# Patient Record
Sex: Female | Born: 1995 | Hispanic: Yes | Marital: Married | Smoking: Current every day smoker
Health system: Southern US, Community
[De-identification: ages and names within clinical notes are randomized; demographics above are authoritative.]

## PROBLEM LIST (undated history)

## (undated) DIAGNOSIS — Z789 Other specified health status: Secondary | ICD-10-CM

## (undated) DIAGNOSIS — O1490 Unspecified pre-eclampsia, unspecified trimester: Secondary | ICD-10-CM

## (undated) HISTORY — PX: WISDOM TOOTH EXTRACTION: SHX21

## (undated) HISTORY — PX: NO PAST SURGERIES: SHX2092

## (undated) HISTORY — DX: Unspecified pre-eclampsia, unspecified trimester: O14.90

---

## 2015-07-05 ENCOUNTER — Encounter (HOSPITAL_COMMUNITY): Payer: Self-pay | Admitting: *Deleted

## 2015-07-05 ENCOUNTER — Emergency Department (HOSPITAL_COMMUNITY)
Admission: EM | Admit: 2015-07-05 | Discharge: 2015-07-05 | Disposition: A | Discharge status: Home / Self Care | Payer: Self-pay | Attending: Emergency Medicine | Admitting: Emergency Medicine

## 2015-07-05 ENCOUNTER — Emergency Department (HOSPITAL_COMMUNITY): Payer: Self-pay

## 2015-07-05 DIAGNOSIS — Y9289 Other specified places as the place of occurrence of the external cause: Secondary | ICD-10-CM | POA: Insufficient documentation

## 2015-07-05 DIAGNOSIS — Y9389 Activity, other specified: Secondary | ICD-10-CM | POA: Insufficient documentation

## 2015-07-05 DIAGNOSIS — Z720 Tobacco use: Secondary | ICD-10-CM | POA: Insufficient documentation

## 2015-07-05 DIAGNOSIS — S93402A Sprain of unspecified ligament of left ankle, initial encounter: Secondary | ICD-10-CM | POA: Insufficient documentation

## 2015-07-05 DIAGNOSIS — Y998 Other external cause status: Secondary | ICD-10-CM | POA: Insufficient documentation

## 2015-07-05 DIAGNOSIS — W51XXXA Accidental striking against or bumped into by another person, initial encounter: Secondary | ICD-10-CM | POA: Insufficient documentation

## 2015-07-05 DIAGNOSIS — S8002XA Contusion of left knee, initial encounter: Secondary | ICD-10-CM | POA: Insufficient documentation

## 2015-07-05 MED ORDER — ACETAMINOPHEN 325 MG PO TABS
650.0000 mg | ORAL_TABLET | Freq: Once | ORAL | Status: AC
  Administered 2015-07-05: 650 mg via ORAL
  Filled 2015-07-05: qty 2

## 2015-07-05 MED ORDER — IBUPROFEN 800 MG PO TABS
800.0000 mg | ORAL_TABLET | Freq: Once | ORAL | Status: AC
  Administered 2015-07-05: 800 mg via ORAL
  Filled 2015-07-05: qty 1

## 2015-07-05 NOTE — ED Provider Notes (Signed)
CSN: [id number]     Arrival date & time 07/05/15  1138 History   First MD Initiated Contact with Patient 07/05/15 1318     Chief Complaint  Patient presents with  . Leg Pain     (Consider location/radiation/quality/duration/timing/severity/associated sxs/prior Treatment) HPI Comments: Patient is a 19 year old female who presents to the emergency department with left leg pain. The patient states that on Wednesday, July 27 she and her sister were playing on the bed, when her sister fell on her left leg and she injured the left knee and the left ankle. Patient states she's been unable to put weight on the leg, has been using an Ace wrap and crutches, but with minimal improvement. His been no previous operations or procedures involving the left knee or the left ankle. The patient presents at this time for further evaluation due to the slow progression of her healing process.  Patient is a 19 y.o. female presenting with leg pain. The history is provided by the patient.  Leg Pain Associated symptoms: no back pain and no neck pain     History reviewed. No pertinent past medical history. History reviewed. No pertinent past surgical history. History reviewed. No pertinent family history. History  Substance Use Topics  . Smoking status: Current Every Day Smoker    Types: Cigarettes  . Smokeless tobacco: Not on file  . Alcohol Use: No   OB History    No data available     Review of Systems  Constitutional: Negative for activity change.       All ROS Neg except as noted in HPI  HENT: Negative for nosebleeds.   Eyes: Negative for photophobia and discharge.  Respiratory: Negative for cough, shortness of breath and wheezing.   Cardiovascular: Negative for chest pain and palpitations.  Gastrointestinal: Negative for abdominal pain and blood in stool.  Genitourinary: Negative for dysuria, frequency and hematuria.  Musculoskeletal: Negative for back pain, arthralgias and neck pain.  Skin:  Negative.   Neurological: Negative for dizziness, seizures and speech difficulty.  Psychiatric/Behavioral: Negative for hallucinations and confusion.      Allergies  Review of patient's allergies indicates no known allergies.  Home Medications   Prior to Admission medications   Not on File   BP 102/66 mmHg  Pulse 74  Temp(Src) 98 F (36.7 C) (Oral)  Resp 14  Ht 4' 9.5" (1.461 m)  Wt 98 lb (44.453 kg)  BMI 20.83 kg/m2  SpO2 100%  LMP 06/30/2015 Physical Exam  Constitutional: She is oriented to person, place, and time. She appears well-developed and well-nourished.  Non-toxic appearance.  HENT:  Head: Normocephalic.  Right Ear: Tympanic membrane and external ear normal.  Left Ear: Tympanic membrane and external ear normal.  Eyes: EOM and lids are normal. Pupils are equal, round, and reactive to light.  Neck: Normal range of motion. Neck supple. Carotid bruit is not present.  Cardiovascular: Normal rate, regular rhythm, normal heart sounds, intact distal pulses and normal pulses.   Pulmonary/Chest: Breath sounds normal. No respiratory distress.  Abdominal: Soft. Bowel sounds are normal. There is no tenderness. There is no guarding.  Musculoskeletal: Normal range of motion.  There is full range of motion of the left hip. There is pain with flexion and extension of the left knee. There is no effusion present. There is no posterior mass noted. There is no deformity of the quadricep area, or the patella tendon. The left knee is not hot.  There is pain with range of motion  of the left ankle. There is dorsal area tenderness extending into the lateral malleolus.  Lymphadenopathy:       Head (right side): No submandibular adenopathy present.       Head (left side): No submandibular adenopathy present.    She has no cervical adenopathy.  Neurological: She is alert and oriented to person, place, and time. She has normal strength. No cranial nerve deficit or sensory deficit.  Skin:  Skin is warm and dry.  Psychiatric: She has a normal mood and affect. Her speech is normal.  Nursing note and vitals reviewed.   ED Course  Procedures (including critical care time) Labs Review Labs Reviewed - No data to display  Imaging Review Dg Ankle Complete Left  07/05/2015   CLINICAL DATA:  Left knee and ankle pain.  Fall Wednesday.  EXAM: LEFT ANKLE COMPLETE - 3+ VIEW  COMPARISON:  None.  FINDINGS: There is no evidence of fracture, dislocation, or joint effusion. There is no evidence of arthropathy or other focal bone abnormality. Soft tissues are unremarkable.  IMPRESSION: Negative.   Electronically Signed   By: Charlett Nose M.D.   On: 07/05/2015 12:36   Dg Knee Complete 4 Views Left  07/05/2015   CLINICAL DATA:  Left knee and ankle pain. Fell on left leg Wednesday night.  EXAM: LEFT KNEE - COMPLETE 4+ VIEW  COMPARISON:  None.  FINDINGS: There is no evidence of fracture, dislocation, or joint effusion. There is no evidence of arthropathy or other focal bone abnormality. Soft tissues are unremarkable.  IMPRESSION: Negative.   Electronically Signed   By: Charlett Nose M.D.   On: 07/05/2015 12:35     EKG Interpretation None      MDM  X-ray of the left ankle is negative for fracture or dislocation or joint effusion. X-ray of the left knee is negative for fracture or dislocation or joint effusion. Suspect that the patient has a contusion of the left knee, and sprain of the left ankle. The patient will be fitted with an ankle stirrup splint. Patient has her own crutches. I've asked patient to keep the left extremity elevated, to use 600 mg of ibuprofen 4 times daily, and to see the primary physician or return to the emergency department if not improving. Patient will receive a work excuse for August 1, returning to work on August 2.    Final diagnoses:  Contusion, knee, left, initial encounter  Ankle sprain, left, initial encounter    *I have reviewed nursing notes, vital signs, and  all appropriate lab and imaging results for this patient.[address], PA-C 07/05/15 1355  Rolland Porter, MD 07/11/15 [phone]

## 2015-07-05 NOTE — ED Notes (Signed)
EDPa in room with pt. 

## 2015-07-05 NOTE — ED Notes (Signed)
Pt states sister fell on her left leg on Wednesday night while playing around on the bed, pt states unable to put weight on leg, pt has ACE wrap and knee brace that she applied at home PTA

## 2015-07-05 NOTE — Discharge Instructions (Signed)
The x-ray of your knee and ankle are negative for fracture or dislocation. Please use the ankle splint over the next 7 days. Please elevate your legs above your waist over the next 3 or 4 days. Please use 600 mg of ibuprofen with breakfast, lunch, dinner, and at bedtime until soreness has resolved. Please use your crutches until you can safely apply weight to your left lower extremity. Ankle Sprain An ankle sprain is an injury to the strong, fibrous tissues (ligaments) that hold your ankle bones together.  HOME CARE   Put ice on your ankle for 1-2 days or as told by your doctor.  Put ice in a plastic bag.  Place a towel between your skin and the bag.  Leave the ice on for 15-20 minutes at a time, every 2 hours while you are awake.  Only take medicine as told by your doctor.  Raise (elevate) your injured ankle above the level of your heart as much as possible for 2-3 days.  Use crutches if your doctor tells you to. Slowly put your own weight on the affected ankle. Use the crutches until you can walk without pain.  If you have a plaster splint:  Do not rest it on anything harder than a pillow for 24 hours.  Do not put weight on it.  Do not get it wet.  Take it off to shower or bathe.  If given, use an elastic wrap or support stocking for support. Take the wrap off if your toes lose feeling (numb), tingle, or turn cold or blue.  If you have an air splint:  Add or let out air to make it comfortable.  Take it off at night and to shower and bathe.  Wiggle your toes and move your ankle up and down often while you are wearing it. GET HELP IF:  You have rapidly increasing bruising or puffiness (swelling).  Your toes feel very cold.  You lose feeling in your foot.  Your medicine does not help your pain. GET HELP RIGHT AWAY IF:   Your toes lose feeling (numb) or turn blue.  You have severe pain that is increasing. MAKE SURE YOU:   Understand these instructions.  Will  watch your condition.  Will get help right away if you are not doing well or get worse. Document Released: 05/10/2008 Document Revised: 04/08/2014 Document Reviewed: 06/05/2012 [facility] Patient Information 2015 [place], [place]. This information is not intended to replace advice given to you by your health care provider. Make sure you discuss any questions you have with your health care provider.

## 2017-11-25 ENCOUNTER — Encounter: Payer: Self-pay | Admitting: Adult Health

## 2017-11-25 ENCOUNTER — Encounter: Payer: Self-pay | Admitting: Women's Health

## 2017-11-25 ENCOUNTER — Ambulatory Visit: Payer: Self-pay | Admitting: Women's Health

## 2017-11-25 DIAGNOSIS — Z3046 Encounter for surveillance of implantable subdermal contraceptive: Secondary | ICD-10-CM

## 2017-11-25 NOTE — Progress Notes (Signed)
   NEXPLANON REMOVAL Patient name: Tara [name] MRN [id number]  Date of birth: [birth year] Subjective Findings:   Tara [name] is a 21 y.o. G0 Hispanic female being seen today for removal of a Nexplanon. [facility]  She desires removal because it's time. Signed copy of informed consent in chart.   Patient's last menstrual period was 10/29/2017. Last pap not yet, turned 21yo in May. Results were:  n/a The planned method of family planning is condoms  Pertinent History Reviewed:   Reviewed past medical,surgical, social, obstetrical and family history.  Reviewed problem list, medications and allergies. Objective Findings & Procedure:    Vitals:   11/25/17 1039  BP: 110/70  Pulse: 94  Weight: 112 lb 6.4 oz (51 kg)  Height: 4\' 9"  (1.448 m)  Body mass index is 24.32 kg/m.  No results found for this or any previous visit (from the past 24 hour(s)).   Time out was performed.  Nexplanon site identified.  Area prepped in usual sterile fashon. One cc of 2% lidocaine was used to anesthetize the area at the distal end of the implant. A small stab incision was made right beside the implant on the distal portion.  The Nexplanon rod was grasped using hemostats and removed without difficulty.  There was less than 3 cc blood loss. There were no complications.  Steri-strips were applied over the small incision and a pressure bandage was applied.  The patient tolerated the procedure well. Assessment & Plan:   1) Nexplanon removal She was instructed to keep the area clean and dry, remove pressure bandage in 24 hours, and keep insertion site covered with the steri-strip for 3-5 days.   Follow-up PRN problems.  No orders of the defined types were placed in this encounter.   Follow-up: Return for here or [facility] for pap & physical.  Marge DuncansBooker, Kimberly Randall CNM, [facility] 11/25/2017 11:11 AM

## 2017-11-25 NOTE — Patient Instructions (Signed)
Keep the area clean and dry.  You can remove the big bandage in 24 hours, and the small steri-strip bandage in 3-5 days.   If you have any concerns, please give us a call.   

## 2017-12-06 NOTE — L&D Delivery Note (Addendum)
Delivery Note At 1:42 PM a viable female was delivered via Vaginal, Spontaneous (Presentation: ROA).  APGAR: , ; weight  .   Placenta status: Intact, spontaneous delivery.  Cord: 3 vessel with no complications.  Cord pH: n/a  Patient is a 22 y.o. now G1P0 s/p NSVD at [redacted]w[redacted]d, who was admitted for IOL for preeclampsia.  She progressed with Pitocin augmentation to complete and pushed through 3 contractions to deliver.  Cord clamping delayed by one minute then clamped by CNM and cut by FOB.  Placenta intact and spontaneous, bleeding minimal.  No  Laceration.  She plans on breastfeeding. She requests Nexplanon for birth control.  Anesthesia: Epidural Episiotomy: None Lacerations: None Suture Repair: n/a Est. Blood Loss (mL): 150  Mom to postpartum.  Baby to Couplet care / Skin to Skin.  Bernerd Limbo, CNM 10/10/18, 2:01 PM

## 2018-04-13 ENCOUNTER — Other Ambulatory Visit (HOSPITAL_COMMUNITY): Payer: Self-pay | Admitting: Family

## 2018-04-13 DIAGNOSIS — Z369 Encounter for antenatal screening, unspecified: Secondary | ICD-10-CM

## 2018-04-13 LAB — OB RESULTS CONSOLE HIV ANTIBODY (ROUTINE TESTING): HIV: NONREACTIVE

## 2018-04-13 LAB — OB RESULTS CONSOLE RPR: RPR: NONREACTIVE

## 2018-04-13 LAB — OB RESULTS CONSOLE GC/CHLAMYDIA
Chlamydia: NEGATIVE
GC PROBE AMP, GENITAL: NEGATIVE

## 2018-04-13 LAB — OB RESULTS CONSOLE HEPATITIS B SURFACE ANTIGEN: Hepatitis B Surface Ag: NEGATIVE

## 2018-04-13 LAB — OB RESULTS CONSOLE RUBELLA ANTIBODY, IGM: RUBELLA: IMMUNE

## 2018-05-02 ENCOUNTER — Encounter (HOSPITAL_COMMUNITY): Payer: Self-pay

## 2018-05-08 ENCOUNTER — Encounter (HOSPITAL_COMMUNITY): Payer: Self-pay | Admitting: *Deleted

## 2018-05-09 ENCOUNTER — Encounter (HOSPITAL_COMMUNITY): Payer: Self-pay

## 2018-05-09 ENCOUNTER — Ambulatory Visit (HOSPITAL_COMMUNITY)
Admission: RE | Admit: 2018-05-09 | Discharge: 2018-05-09 | Disposition: A | Discharge status: Home / Self Care | Payer: Medicaid Other | Source: Ambulatory Visit | Attending: Family | Admitting: Family

## 2018-05-09 ENCOUNTER — Other Ambulatory Visit (HOSPITAL_COMMUNITY): Payer: Self-pay | Admitting: Family

## 2018-05-09 DIAGNOSIS — O99331 Smoking (tobacco) complicating pregnancy, first trimester: Secondary | ICD-10-CM | POA: Insufficient documentation

## 2018-05-09 DIAGNOSIS — Z369 Encounter for antenatal screening, unspecified: Secondary | ICD-10-CM

## 2018-05-09 DIAGNOSIS — Z3A14 14 weeks gestation of pregnancy: Secondary | ICD-10-CM | POA: Insufficient documentation

## 2018-05-09 DIAGNOSIS — O99332 Smoking (tobacco) complicating pregnancy, second trimester: Secondary | ICD-10-CM

## 2018-05-09 DIAGNOSIS — Z3689 Encounter for other specified antenatal screening: Secondary | ICD-10-CM

## 2018-05-09 HISTORY — DX: Other specified health status: Z78.9

## 2018-08-24 IMAGING — US US MFM OB COMP +14 WKS
1 series · 14 of 28 positions shown · non-contrast
Comparison: none

[Series 1: us mfm ob comp +14 wks · 14 of 45 slices shown]
[im 2/45]
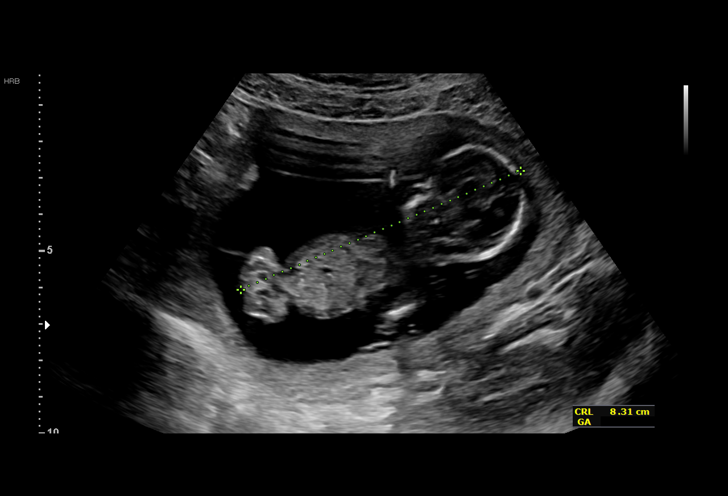
[im 5/45]
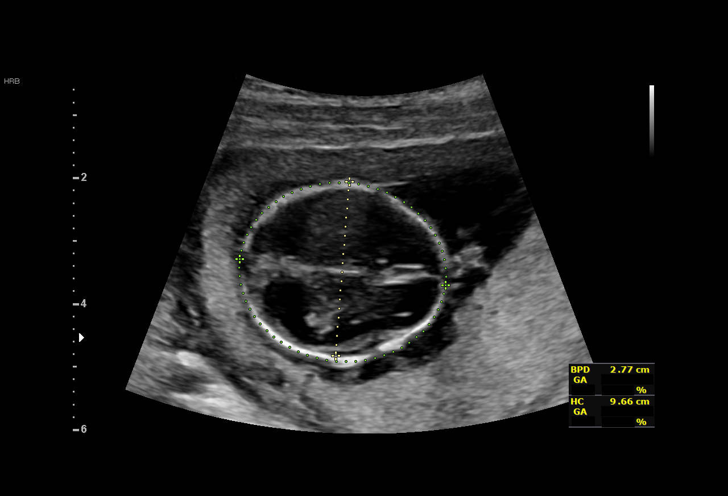
[im 9/45]
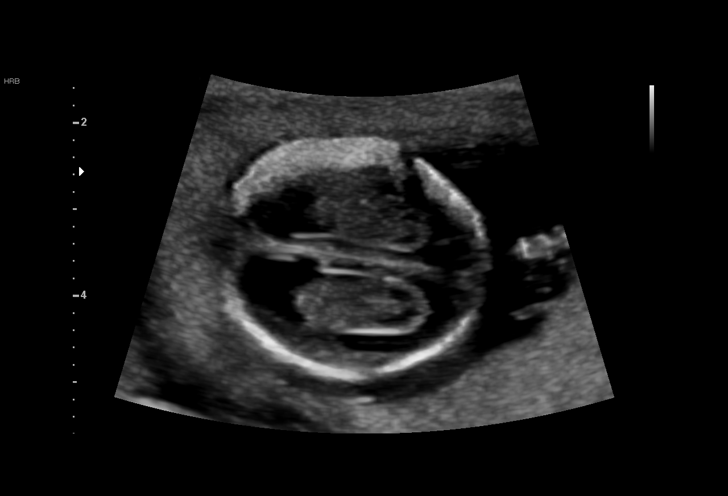
[im 12/45]
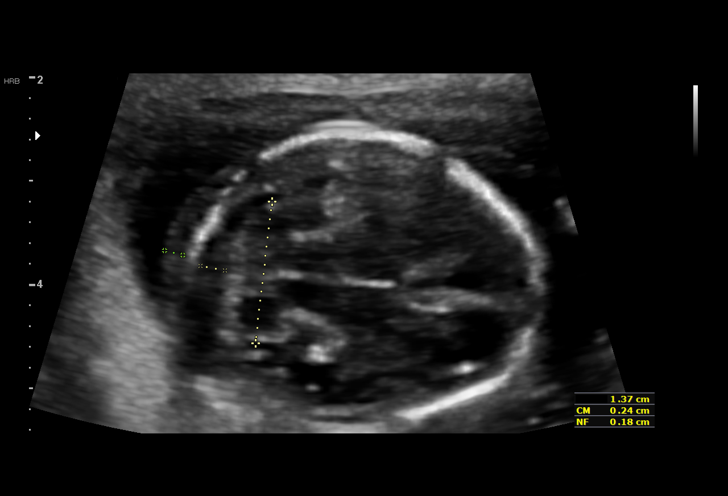
[im 15/45]
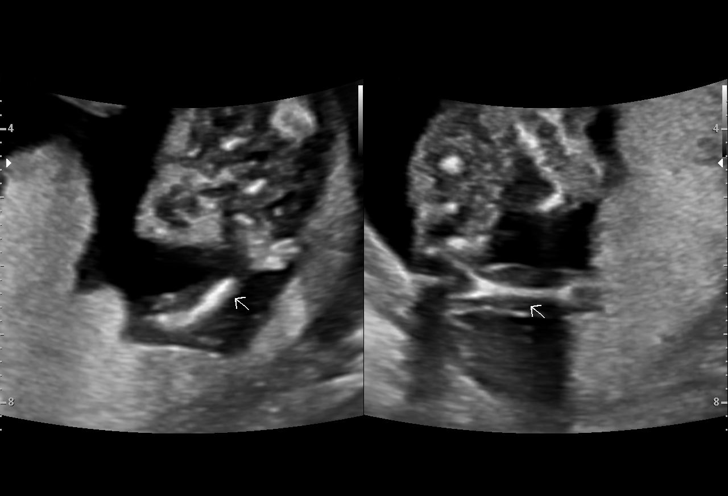
[im 18/45]
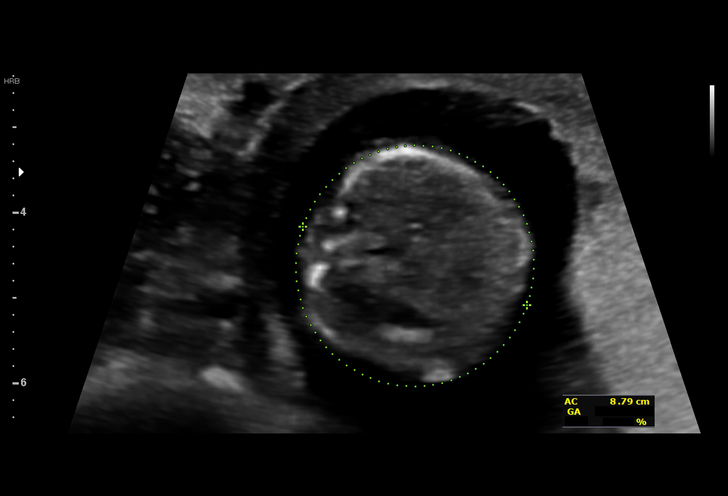
[im 22/45]
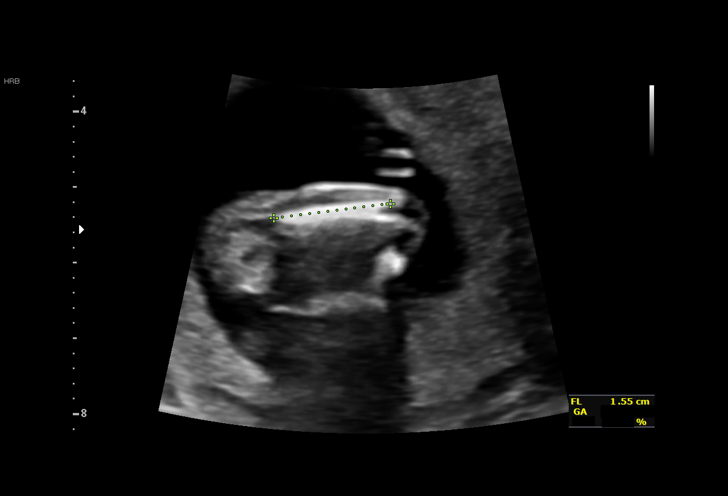
[im 25/45]
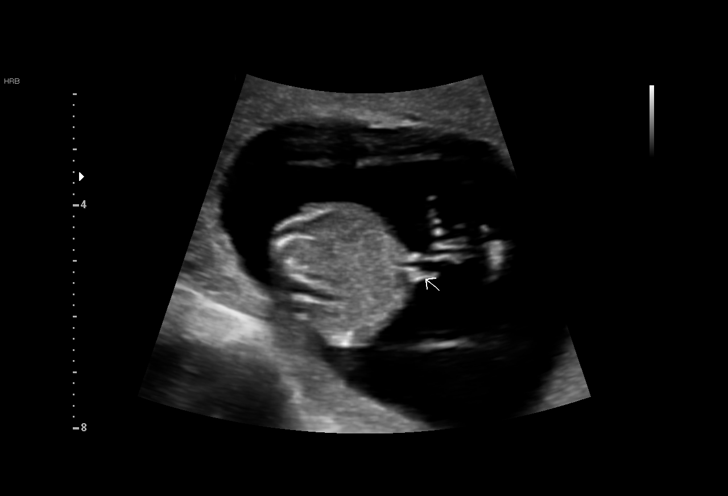
[im 28/45]
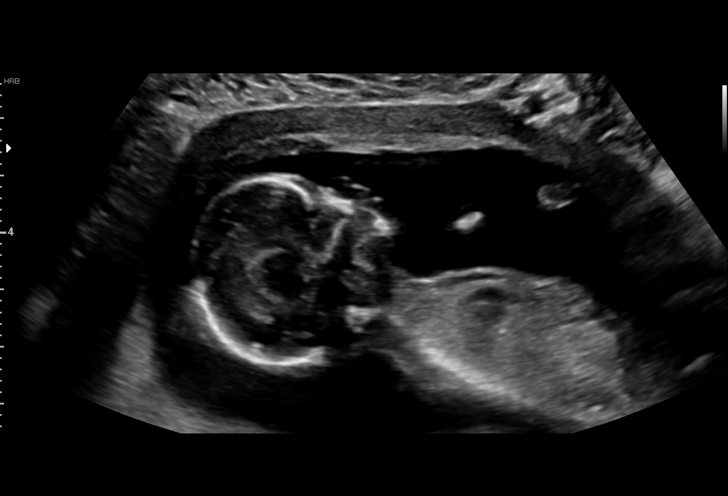
[im 31/45]
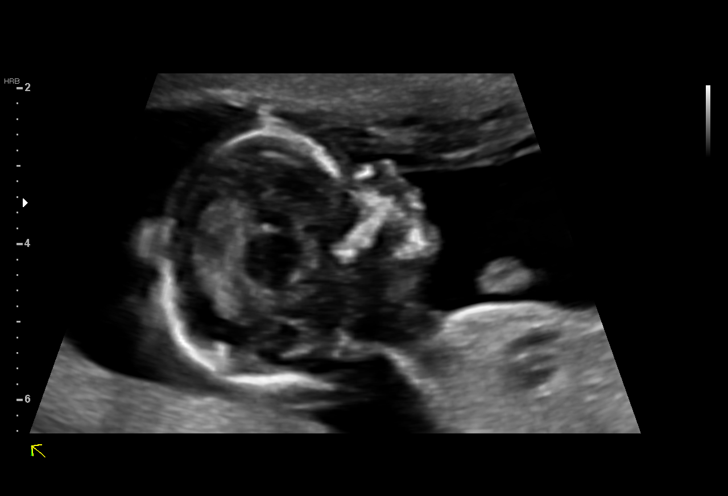
[im 35/45]
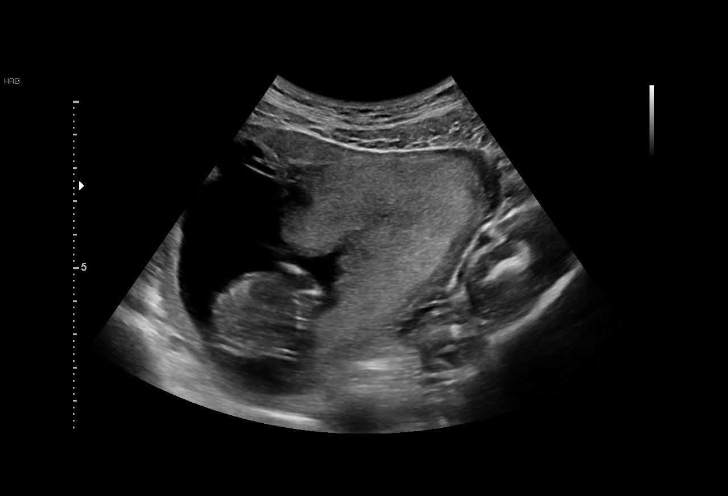
[im 38/45]
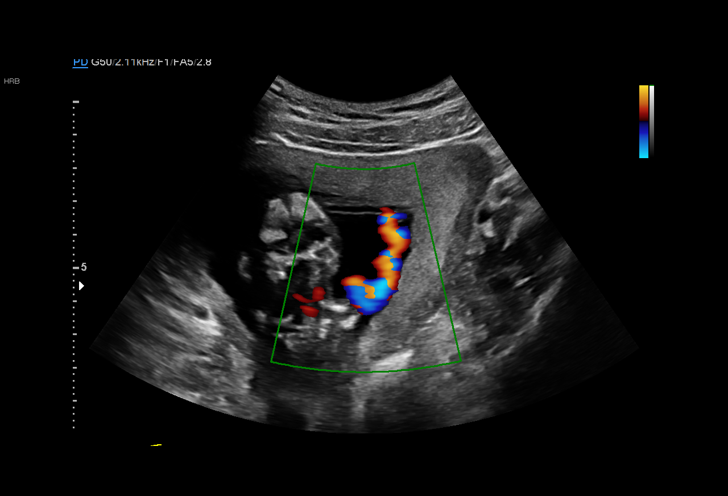
[im 41/45]
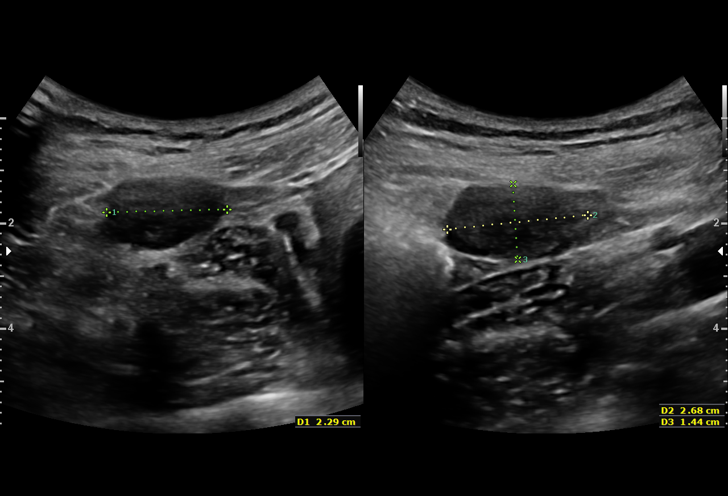
[im 45/45]
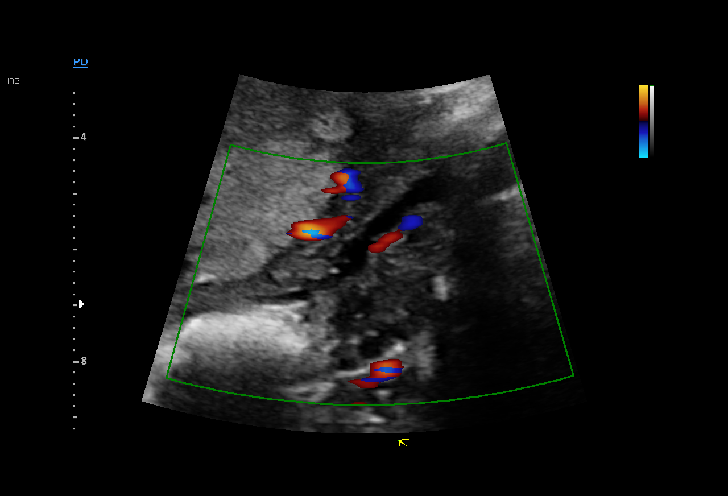

[14 of 28 positions shown; findings below may reference images not displayed]

Health Department
Morice NP

[address]                [id number]      [phone]     [id number]
PAA QUASI
Indications

14 weeks gestation of pregnancy
Encounter for antenatal screening for
malformations
Tobacco use complicating pregnancy, first
trimester
OB History

Blood Type:            Height:  4'10"  Weight (lb):  114       BMI:
Gravidity:    1
Fetal Evaluation

Num Of Fetuses:     1
Fetal Heart         150
Rate(bpm):
Cardiac Activity:   Observed
Presentation:       Variable
Placenta:           Anterior, above cervical os
P. Cord Insertion:  Visualized, central

Amniotic Fluid
AFI FV:      Subjectively within normal limits

Largest Pocket(cm)
4.63
Biometry

BPD:      28.2  mm     G. Age:  15w 0d                  CI:         82.1   %    70 - 86
FL/HC:      14.3   %    15.3 -
HC:       98.2  mm     G. Age:  14w 4d                  HC/AC:      1.14        1.05 -
AC:       86.5  mm     G. Age:  15w 0d                  FL/BPD:     49.6   %
FL:         14  mm     G. Age:  14w 1d                  FL/AC:      16.2   %    20 - 24
HUM:      12.6  mm     G. Age:  13w 3d
CER:      13.7  mm     G. Age:  N/A
NFT:       1.8  mm
CM:        2.4  mm

Est. FW:     100  gm      0 lb 4 oz
Gestational Age

LMP:           13w 5d        Date:  02/02/18                 EDD:   11/09/18
U/S Today:     14w 5d                                        EDD:   11/02/18
Best:          14w 5d     Det. By:  U/S (05/09/18)           EDD:   11/02/18
Anatomy

Cranium:               Appears normal         Aortic Arch:            Not well visualized
Cavum:                 Appears normal         Ductal Arch:            Not well visualized
Ventricles:            Not well visualized    Diaphragm:              Not well visualized
Choroid Plexus:        Heterogenous           Stomach:                Appears normal, left
sided
Cerebellum:            Not well visualized    Abdomen:                Appears normal
Posterior Fossa:       Appears normal         Abdominal Wall:         Appears nml (cord
insert, abd wall)
Nuchal Fold:           Appears normal         Cord Vessels:           Not well visualized
Face:                  Profile nl; orbits not Kidneys:                Not well visualized
well visualized
Lips:                  Not well visualized    Bladder:                Appears normal
Thoracic:              Appears normal         Spine:                  Not well visualized
Heart:                 Not well visualized    Upper Extremities:      Appears normal
RVOT:                  Not well visualized    Lower Extremities:      Appears normal
LVOT:                  Not well visualized

Other:  Technically difficult due to early gestational age.
Cervix Uterus Adnexa

Cervix
Normal appearance by transabdominal scan.

Uterus
No abnormality visualized.

Left Ovary
Size(cm)     2.05   x   1.08   x  1.77      Vol(ml):
Within normal limits.

Right Ovary
Size(cm)     2.68   x   1.44   x  2.29      Vol(ml):
Within normal limits.

Cul De Sac:   No free fluid seen.

Adnexa:       No abnormality visualized.
Impression

SIUP at 14+5 weeks
Normal but limited detailed fetal anatomy; no gross
abnormalities identified
Markers of aneuploidy: none
Normal amniotic fluid volume
EDC based on today's measurements: 11/02/18
Discussed option of quad screen starting at 15 weeks
Recommendations

Follow-up ultrasound in 4-6 weeks to complete anatomy
survey

## 2018-10-09 ENCOUNTER — Other Ambulatory Visit: Payer: Self-pay

## 2018-10-09 ENCOUNTER — Inpatient Hospital Stay (HOSPITAL_COMMUNITY)
Admission: AD | Admit: 2018-10-09 | Discharge: 2018-10-12 | DRG: 807 | Disposition: A | Discharge status: Home / Self Care | Payer: Medicaid Other | Attending: Family Medicine | Admitting: Family Medicine

## 2018-10-09 ENCOUNTER — Encounter (HOSPITAL_COMMUNITY): Payer: Self-pay

## 2018-10-09 DIAGNOSIS — O42913 Preterm premature rupture of membranes, unspecified as to length of time between rupture and onset of labor, third trimester: Principal | ICD-10-CM | POA: Diagnosis present

## 2018-10-09 DIAGNOSIS — O42013 Preterm premature rupture of membranes, onset of labor within 24 hours of rupture, third trimester: Secondary | ICD-10-CM | POA: Diagnosis not present

## 2018-10-09 DIAGNOSIS — O1414 Severe pre-eclampsia complicating childbirth: Secondary | ICD-10-CM | POA: Diagnosis present

## 2018-10-09 DIAGNOSIS — F17210 Nicotine dependence, cigarettes, uncomplicated: Secondary | ICD-10-CM | POA: Diagnosis present

## 2018-10-09 DIAGNOSIS — O9902 Anemia complicating childbirth: Secondary | ICD-10-CM | POA: Diagnosis present

## 2018-10-09 DIAGNOSIS — D649 Anemia, unspecified: Secondary | ICD-10-CM | POA: Diagnosis present

## 2018-10-09 DIAGNOSIS — Z3A36 36 weeks gestation of pregnancy: Secondary | ICD-10-CM | POA: Diagnosis not present

## 2018-10-09 DIAGNOSIS — E669 Obesity, unspecified: Secondary | ICD-10-CM | POA: Diagnosis present

## 2018-10-09 DIAGNOSIS — O99334 Smoking (tobacco) complicating childbirth: Secondary | ICD-10-CM | POA: Diagnosis present

## 2018-10-09 DIAGNOSIS — O99214 Obesity complicating childbirth: Secondary | ICD-10-CM | POA: Diagnosis present

## 2018-10-09 DIAGNOSIS — O4290 Premature rupture of membranes, unspecified as to length of time between rupture and onset of labor, unspecified weeks of gestation: Secondary | ICD-10-CM | POA: Diagnosis present

## 2018-10-09 DIAGNOSIS — O1410 Severe pre-eclampsia, unspecified trimester: Secondary | ICD-10-CM | POA: Diagnosis not present

## 2018-10-09 LAB — TYPE AND SCREEN
ABO/RH(D): O POS
Antibody Screen: NEGATIVE

## 2018-10-09 LAB — CBC
HCT: 33.3 % — ABNORMAL LOW (ref 36.0–46.0)
Hemoglobin: 11.2 g/dL — ABNORMAL LOW (ref 12.0–15.0)
MCH: 29.1 pg (ref 26.0–34.0)
MCHC: 33.6 g/dL (ref 30.0–36.0)
MCV: 86.5 fL (ref 80.0–100.0)
PLATELETS: 169 10*3/uL (ref 150–400)
RBC: 3.85 MIL/uL — AB (ref 3.87–5.11)
RDW: 12.8 % (ref 11.5–15.5)
WBC: 9.5 10*3/uL (ref 4.0–10.5)

## 2018-10-09 LAB — RPR: RPR: NONREACTIVE

## 2018-10-09 LAB — COMPREHENSIVE METABOLIC PANEL
ALT: 12 U/L (ref 0–44)
AST: 20 U/L (ref 15–41)
Albumin: 2.9 g/dL — ABNORMAL LOW (ref 3.5–5.0)
Alkaline Phosphatase: 247 U/L — ABNORMAL HIGH (ref 38–126)
Anion gap: 8 (ref 5–15)
BILIRUBIN TOTAL: 0.6 mg/dL (ref 0.3–1.2)
BUN: 7 mg/dL (ref 6–20)
CO2: 21 mmol/L — ABNORMAL LOW (ref 22–32)
CREATININE: 0.54 mg/dL (ref 0.44–1.00)
Calcium: 8.4 mg/dL — ABNORMAL LOW (ref 8.9–10.3)
Chloride: 106 mmol/L (ref 98–111)
Glucose, Bld: 82 mg/dL (ref 70–99)
POTASSIUM: 3.9 mmol/L (ref 3.5–5.1)
Sodium: 135 mmol/L (ref 135–145)
Total Protein: 5.9 g/dL — ABNORMAL LOW (ref 6.5–8.1)

## 2018-10-09 LAB — PROTEIN / CREATININE RATIO, URINE
Creatinine, Urine: 32 mg/dL
Protein Creatinine Ratio: 0.97 mg/mg{Cre} — ABNORMAL HIGH (ref 0.00–0.15)
Total Protein, Urine: 31 mg/dL

## 2018-10-09 LAB — ABO/RH: ABO/RH(D): O POS

## 2018-10-09 LAB — POCT FERN TEST: POCT Fern Test: POSITIVE

## 2018-10-09 MED ORDER — DIPHENHYDRAMINE HCL 50 MG/ML IJ SOLN: 12.5000 mg | INTRAMUSCULAR | Status: DC | PRN

## 2018-10-09 MED ORDER — LACTATED RINGERS IV SOLN
500.0000 mL | INTRAVENOUS | Status: DC | PRN
  Administered 2018-10-09: 1000 mL via INTRAVENOUS

## 2018-10-09 MED ORDER — PNEUMOCOCCAL VAC POLYVALENT 25 MCG/0.5ML IJ INJ
0.5000 mL | INJECTION | INTRAMUSCULAR | Status: DC
  Filled 2018-10-09: qty 0.5

## 2018-10-09 MED ORDER — LACTATED RINGERS IV SOLN
INTRAVENOUS | Status: DC
  Administered 2018-10-09 – 2018-10-10 (×5): via INTRAVENOUS

## 2018-10-09 MED ORDER — OXYTOCIN BOLUS FROM INFUSION
500.0000 mL | Freq: Once | INTRAVENOUS | Status: AC
  Administered 2018-10-10: 500 mL via INTRAVENOUS

## 2018-10-09 MED ORDER — MISOPROSTOL 50MCG HALF TABLET
50.0000 ug | ORAL_TABLET | ORAL | Status: DC | PRN
  Administered 2018-10-09 (×4): 50 ug via ORAL
  Filled 2018-10-09 (×6): qty 1

## 2018-10-09 MED ORDER — SODIUM CHLORIDE 0.9 % IV SOLN
5.0000 10*6.[IU] | Freq: Once | INTRAVENOUS | Status: AC
  Administered 2018-10-09: 5 10*6.[IU] via INTRAVENOUS
  Filled 2018-10-09: qty 5

## 2018-10-09 MED ORDER — MAGNESIUM SULFATE BOLUS VIA INFUSION
4.0000 g | Freq: Once | INTRAVENOUS | Status: AC
  Administered 2018-10-09: 4 g via INTRAVENOUS
  Filled 2018-10-09: qty 500

## 2018-10-09 MED ORDER — HYDRALAZINE HCL 20 MG/ML IJ SOLN: 10.0000 mg | INTRAMUSCULAR | Status: DC | PRN

## 2018-10-09 MED ORDER — PHENYLEPHRINE 40 MCG/ML (10ML) SYRINGE FOR IV PUSH (FOR BLOOD PRESSURE SUPPORT)
80.0000 ug | PREFILLED_SYRINGE | INTRAVENOUS | Status: DC | PRN
  Filled 2018-10-09: qty 10
  Filled 2018-10-09: qty 5

## 2018-10-09 MED ORDER — ACETAMINOPHEN 325 MG PO TABS: 650.0000 mg | ORAL_TABLET | ORAL | Status: DC | PRN

## 2018-10-09 MED ORDER — MAGNESIUM SULFATE 40 G IN LACTATED RINGERS - SIMPLE
2.0000 g/h | INTRAVENOUS | Status: DC
  Administered 2018-10-09: 2 g/h via INTRAVENOUS
  Filled 2018-10-09: qty 500

## 2018-10-09 MED ORDER — PENICILLIN G 3 MILLION UNITS IVPB - SIMPLE MED
3.0000 10*6.[IU] | INTRAVENOUS | Status: DC
  Administered 2018-10-09 – 2018-10-10 (×6): 3 10*6.[IU] via INTRAVENOUS
  Filled 2018-10-09 (×8): qty 100

## 2018-10-09 MED ORDER — FENTANYL 2.5 MCG/ML BUPIVACAINE 1/10 % EPIDURAL INFUSION (WH - ANES)
11.0000 mL/h | INTRAMUSCULAR | Status: DC | PRN
  Administered 2018-10-10: 14 mL/h via EPIDURAL
  Filled 2018-10-09: qty 100

## 2018-10-09 MED ORDER — BETAMETHASONE SOD PHOS & ACET 6 (3-3) MG/ML IJ SUSP
12.0000 mg | INTRAMUSCULAR | Status: AC
  Administered 2018-10-09 – 2018-10-10 (×2): 12 mg via INTRAMUSCULAR
  Filled 2018-10-09 (×2): qty 2

## 2018-10-09 MED ORDER — FENTANYL CITRATE (PF) 100 MCG/2ML IJ SOLN
100.0000 ug | INTRAMUSCULAR | Status: DC | PRN
  Administered 2018-10-10 (×2): 100 ug via INTRAVENOUS
  Filled 2018-10-09 (×2): qty 2

## 2018-10-09 MED ORDER — ONDANSETRON HCL 4 MG/2ML IJ SOLN: 4.0000 mg | Freq: Four times a day (QID) | INTRAMUSCULAR | Status: DC | PRN

## 2018-10-09 MED ORDER — OXYTOCIN 40 UNITS IN LACTATED RINGERS INFUSION - SIMPLE MED
2.5000 [IU]/h | INTRAVENOUS | Status: DC
  Filled 2018-10-09: qty 1000

## 2018-10-09 MED ORDER — LABETALOL HCL 5 MG/ML IV SOLN: 80.0000 mg | INTRAVENOUS | Status: DC | PRN

## 2018-10-09 MED ORDER — SOD CITRATE-CITRIC ACID 500-334 MG/5ML PO SOLN: 30.0000 mL | ORAL | Status: DC | PRN

## 2018-10-09 MED ORDER — TERBUTALINE SULFATE 1 MG/ML IJ SOLN: 0.2500 mg | Freq: Once | INTRAMUSCULAR | Status: DC | PRN

## 2018-10-09 MED ORDER — LIDOCAINE HCL (PF) 1 % IJ SOLN
30.0000 mL | INTRAMUSCULAR | Status: DC | PRN
  Filled 2018-10-09: qty 30

## 2018-10-09 MED ORDER — LACTATED RINGERS IV SOLN: 500.0000 mL | Freq: Once | INTRAVENOUS | Status: DC

## 2018-10-09 MED ORDER — LABETALOL HCL 5 MG/ML IV SOLN: 20.0000 mg | INTRAVENOUS | Status: DC | PRN

## 2018-10-09 MED ORDER — LABETALOL HCL 5 MG/ML IV SOLN: 40.0000 mg | INTRAVENOUS | Status: DC | PRN

## 2018-10-09 NOTE — H&P (Addendum)
LABOR AND DELIVERY ADMISSION HISTORY AND PHYSICAL NOTE  Tara [name] is a 22 y.o. female G1P0 with IUP at [redacted]w[redacted]d presenting for PPROM confirmed by positive fern test.  She first noticed leakage of "clear, gummy fluid" around 3 AM 10/09/18 that persisted for about 1 hour, which then prompted her to seek evaluation in [facility]. She reports positive fetal movement. Denies contractions, additional leakage of fluid, or vaginal bleeding.  Prenatal History/Complications: PNC at [facility] Pregnancy complications:  - Tobacco use throughout pregnancy (decreased daily cigarette use to 1 cigarette/day at onset of pregnancy) - Otherwise uncomplicated pregnancy. Only medication is a prenatal vitamin and no significant PMHx.  Past Medical History: Past Medical History:  Diagnosis Date  . Medical history non-contributory     Past Surgical History: Past Surgical History:  Procedure Laterality Date  . NO PAST SURGERIES      Obstetrical History: OB History    Gravida  1   Para      Term      Preterm      AB      Living  0     SAB      TAB      Ectopic      Multiple      Live Births              Social History: Social History   Socioeconomic History  . Marital status: Married    Spouse name: Not on file  . Number of children: Not on file  . Years of education: Not on file  . Highest education level: Not on file  Occupational History  . Not on file  Social Needs  . Financial resource strain: Not on file  . Food insecurity:    Worry: Not on file    Inability: Not on file  . Transportation needs:    Medical: Not on file    Non-medical: Not on file  Tobacco Use  . Smoking status: Current Every Day Smoker    Packs/day: 0.10    Types: Cigarettes  . Smokeless tobacco: Current User  Substance and Sexual Activity  . Alcohol use: No  . Drug use: No  . Sexual activity: Yes    Partners: Male    Birth control/protection: None  Lifestyle  . Physical activity:    Days per  week: Not on file    Minutes per session: Not on file  . Stress: Not on file  Relationships  . Social connections:    Talks on phone: Not on file    Gets together: Not on file    Attends religious service: Not on file    Active member of club or organization: Not on file    Attends meetings of clubs or organizations: Not on file    Relationship status: Not on file  Other Topics Concern  . Not on file  Social History Narrative  . Not on file    Family History: Family History  Problem Relation Age of Onset  . Depression Maternal Grandmother   . Arthritis Maternal Grandmother   . Hypertension Maternal Grandmother   . Lung cancer Maternal Grandfather     Allergies: No Known Allergies  Medications Prior to Admission  Medication Sig Dispense Refill Last Dose  . Prenatal Vit-Fe Fumarate-FA (PRENATAL VITAMIN PO) Take by mouth.   10/08/2018 at Unknown time     Review of Systems  All systems reviewed and negative except as stated in HPI  Physical Exam Blood pressure 133/74,  pulse 63, temperature 97.9 F (36.6 C), temperature source Oral, resp. rate 20, height 4' 9.5" (1.461 m), weight 67.6 kg, last menstrual period 02/02/2018, SpO2 100 %. General appearance: alert, oriented, NAD Lungs: normal respiratory effort Heart: regular rate Abdomen: soft, non-tender; gravid, FH appropriate for GA Extremities: No calf swelling or tenderness Presentation: cephalic Fetal monitoring: baseline 130, moderate variability, positive accelerations, no decelerations. Toco: irregular contractions q 2-5 minutes Dilation: 1 Effacement (%): Thick Station: -2 Exam by:: Dr Annia Friendly  Prenatal labs: ABO, Rh: --/--/O POS (11/04 0636) Antibody: NEG (11/04 0636) Rubella: Immune (05/09 0000) RPR: Nonreactive (05/09 0000)  HBsAg: Negative (05/09 0000)  HIV: Non-reactive (05/09 0000)  GC/Chlamydia: Negative (09/14/18) GBS:   Unknown 2-hr GTT:  Genetic screening:  QUAD Negative Anatomy US:  Normal  Prenatal Transfer Tool  Maternal Diabetes: No Genetic Screening: Normal Maternal Ultrasounds/Referrals: Normal Fetal Ultrasounds or other Referrals:  None Maternal Substance Abuse:  Yes:  Type: Smoker Significant Maternal Medications:  None Significant Maternal Lab Results: None  Results for orders placed or performed during the hospital encounter of 10/09/18 (from the past 24 hour(s))  POCT fern test   Collection Time: 10/09/18  4:59 AM  Result Value Ref Range   POCT Fern Test Positive = ruptured amniotic membanes   CBC   Collection Time: 10/09/18  6:36 AM  Result Value Ref Range   WBC 9.5 4.0 - 10.5 K/uL   RBC 3.85 (L) 3.87 - 5.11 MIL/uL   Hemoglobin 11.2 (L) 12.0 - 15.0 g/dL   HCT 91.4 (L) 78.2 - 95.6 %   MCV 86.5 80.0 - 100.0 fL   MCH 29.1 26.0 - 34.0 pg   MCHC 33.6 30.0 - 36.0 g/dL   RDW 21.3 08.6 - 57.8 %   Platelets 169 150 - 400 K/uL  Type and screen [facility]   Collection Time: 10/09/18  6:36 AM  Result Value Ref Range   ABO/RH(D) O POS    Antibody Screen NEG    Sample Expiration      10/12/2018 Performed at [facility], [address], [place], [place] [address]     Patient Active Problem List   Diagnosis Date Noted  . PROM (premature rupture of membranes) 10/09/2018  . Encounter for Nexplanon removal 11/25/2017    Assessment: Tara [name] is a 22 y.o. G1P0 at [redacted]w[redacted]d here s/p PPROM confirmed by positive fern test.  #Labor: Start induction with cytotec and pitocin -- Cytotec 50 mcg q 4hrs PRN for cervical ripening -- Betamethasone x1 administered with possible 2nd dose in 24 hours  #Pain: desires epidural  #FWB: baseline 130, moderate variability, positive accelerations, no decelerations. Toco: irregular contractions q 2-5 minutes  #ID: GBS unknown. Started penicillin ppx.  #MOF: Breast #MOC: Nexplanon #Circ:  Outpatient circ  Hal Neer, MS3 10/09/2018, 11:02 AM   OB FELLOW MEDICAL STUDENT NOTE  ATTESTATION  I confirm that I have verified the information documented in the medical student's note and that I have also personally performed the physical exam and all medical decision making activities.   [name] is 22 y.o. G1P0 female at [redacted]w[redacted]d who presented for PPROM at 0400 with clear fluid. Pregnancy complicated by tobacco use, otherwise unremarkable. Cervix 1/thick/-2 with Cat I FHT at admission. Irregular ctx present. Will start induction with cytotec 50 mg PO. Patient plans for epidural. Plans for outpatient circumcision, breast feeding and nexplanon. Will give BMZ given preterm status. GBS culture obtained. Warrants empiric prophylaxis due to preterm GA. Will  give PCN.    Marcy Siren, D.O. OB Fellow  10/09/2018, 2:16 PM

## 2018-10-09 NOTE — Progress Notes (Signed)
LABOR PROGRESS NOTE  Tara [name] is a 22 y.o. G1P0 at [redacted]w[redacted]d  admitted for IOL for PPROM  Subjective: Patient resting comfortably   Objective: BP 120/64   Pulse 84   Temp 97.9 F (36.6 C) (Axillary)   Resp 16   Ht 4' 9.5" (1.461 m)   Wt 67.6 kg   LMP 02/02/2018   SpO2 100%   BMI 31.68 kg/m  or  Vitals:   10/09/18 2210 10/09/18 2301 10/10/18 0000 10/10/18 0101  BP: 133/76 113/77 (!) 149/84 120/64  Pulse: 89 89 74 84  Resp:   16 16  Temp:    97.9 F (36.6 C)  TempSrc:    Axillary  SpO2:      Weight:      Height:        Dilation: 1 Effacement (%): 70, 80 Cervical Position: Posterior Station: -2 Presentation: Vertex Exam by:: L.Stubbs, RN FHT: baseline rate 120, moderate varibility, +acel, -decel Toco: irregular   Labs: Lab Results  Component Value Date   WBC 9.5 10/09/2018   HGB 11.2 (L) 10/09/2018   HCT 33.3 (L) 10/09/2018   MCV 86.5 10/09/2018   PLT 169 10/09/2018    Patient Active Problem List   Diagnosis Date Noted  . PROM (premature rupture of membranes) 10/09/2018  . Encounter for Nexplanon removal 11/25/2017    Assessment / Plan: 22 y.o. G1P0 at [redacted]w[redacted]d here for IOL for PPROM, now with severe preE  Labor: IOL, s/p cytotec Fetal Wellbeing:  Cat 1 Pain Control:  Planning epidural Anticipated MOD:  NSVD PreE: Pr:Cr 0.97, have started Mg for recurrent BP in severe range. Patient was asymptomatic at that time  Oralia Manis, DO PGY-2 10/10/2018, 1:54 AM

## 2018-10-09 NOTE — Anesthesia Preprocedure Evaluation (Addendum)
Anesthesia Evaluation  Patient identified by MRN, date of birth, ID band Patient awake    Reviewed: Allergy & Precautions, NPO status , Patient's Chart, lab work & pertinent test results  Airway Mallampati: II  TM Distance: >3 FB Neck ROM: Full    Dental no notable dental hx. (+) Teeth Intact   Pulmonary Current Smoker,    Pulmonary exam normal breath sounds clear to auscultation       Cardiovascular negative cardio ROS Normal cardiovascular exam Rhythm:Regular Rate:Normal     Neuro/Psych negative neurological ROS  negative psych ROS   GI/Hepatic Neg liver ROS, GERD  ,  Endo/Other  Obesity  Renal/GU negative Renal ROS  negative genitourinary   Musculoskeletal negative musculoskeletal ROS (+)   Abdominal (+) + obese,   Peds  Hematology  (+) anemia ,   Anesthesia Other Findings Pregnancy c/b preE on Mag  Reproductive/Obstetrics (+) Pregnancy                            Anesthesia Physical Anesthesia Plan  ASA: III  Anesthesia Plan: Epidural   Post-op Pain Management:    Induction:   PONV Risk Score and Plan: 2 and Treatment may vary due to age or medical condition  Airway Management Planned: Natural Airway  Additional Equipment:   Intra-op Plan:   Post-operative Plan:   Informed Consent: I have reviewed the patients History and Physical, chart, labs and discussed the procedure including the risks, benefits and alternatives for the proposed anesthesia with the patient or authorized representative who has indicated his/her understanding and acceptance.     Plan Discussed with: Anesthesiologist  Anesthesia Plan Comments: (Patient identified. Risks, benefits, options discussed with patient including but not limited to bleeding, infection, nerve damage, paralysis, failed block, incomplete pain control, headache, blood pressure changes, nausea, vomiting, reactions to medication,  itching, and post partum back pain. Confirmed with bedside nurse the patient's most recent platelet count. Confirmed with the patient that they are not taking any anticoagulation, have any bleeding history or any family history of bleeding disorders. Patient expressed understanding and wishes to proceed. All questions were answered. )       Anesthesia Quick Evaluation

## 2018-10-09 NOTE — Anesthesia Pain Management Evaluation Note (Signed)
  CRNA Pain Management Visit Note  Patient: Tara [name], 22 y.o., female  "Hello I am a member of the anesthesia team at [facility]. We have an anesthesia team available at all times to provide care throughout the hospital, including epidural management and anesthesia for C-section. I don't know your plan for the delivery whether it a natural birth, water birth, IV sedation, nitrous supplementation, doula or epidural, but we want to meet your pain goals."   1.Was your pain managed to your expectations on prior hospitalizations?   No prior hospitalizations  2.What is your expectation for pain management during this hospitalization?     Epidural  3.How can we help you reach that goal? epidural  Record the patient's initial score and the patient's pain goal.   Pain: 0  Pain Goal: 5 [facility] wants you to be able to say your pain was always managed very well.  Tashira Torre 10/09/2018

## 2018-10-09 NOTE — Progress Notes (Signed)
LABOR PROGRESS NOTE  Patrece Tallie is a 22 y.o. G1P0 at [redacted]w[redacted]d  admitted for IOL due to PPROM.   Subjective: Strip Note   Objective: BP 140/86   Pulse 64   Temp 98.2 F (36.8 C) (Oral)   Resp 20   Ht 4' 9.5" (1.461 m)   Wt 67.6 kg   LMP 02/02/2018   SpO2 100%   BMI 31.68 kg/m  or  Vitals:   10/09/18 1202 10/09/18 1251 10/09/18 1301 10/09/18 1401  BP: (!) 154/56 (!) 141/82 130/79 140/86  Pulse: (!) 183 61 (!) 56 64  Resp: 20 18 20 20   Temp: 98.2 F (36.8 C)     TempSrc: Oral     SpO2:      Weight:      Height:        Dilation: 1 Effacement (%): Thick Station: -2 Presentation: Vertex Exam by:: Dr Annia Friendly FHT: baseline rate 120, moderate varibility, +acel, -decel Toco: every 4-6 min   Labs: Lab Results  Component Value Date   WBC 9.5 10/09/2018   HGB 11.2 (L) 10/09/2018   HCT 33.3 (L) 10/09/2018   MCV 86.5 10/09/2018   PLT 169 10/09/2018    Patient Active Problem List   Diagnosis Date Noted  . PROM (premature rupture of membranes) 10/09/2018  . Encounter for Nexplanon removal 11/25/2017    Assessment / Plan: 22 y.o. G1P0 at [redacted]w[redacted]d here for IOL due to PPROM.   Labor: IOL, s/p cytotec, additional cytotec placed at 1450.  Fetal Wellbeing:  Cat 1 strip  Pain Control:  Planning on epidural  Anticipated MOD:  NSVD   1. New onset gHTN: Stable, last BP 140/86. Asymptomatic. Mild to moderate range since this morning. HELLP labs wnl, UPC pending.   -Monitor BP, IV labetalol PRN   -F/u UPC   2. PPROM: 36 and 4.   -BMZ given x1, next dose 1045 tomorrow am if not delivered.   -PCN for GBS unknown, given preterm status   Leticia Penna, D.O. Family Medicine PGY-1  10/09/2018, 2:53 PM

## 2018-10-09 NOTE — MAU Note (Signed)
Pt states about 1 hour prior to arrival she had a gush of clear fluid that has continued to trickle out. Pt denies pain or contractions. Reports a small amount of reddish mucus. Reports good fetal movement.

## 2018-10-09 NOTE — Progress Notes (Signed)
Charted on wrong chart 

## 2018-10-10 ENCOUNTER — Inpatient Hospital Stay (HOSPITAL_COMMUNITY): Payer: Medicaid Other | Admitting: Anesthesiology

## 2018-10-10 ENCOUNTER — Encounter (HOSPITAL_COMMUNITY): Payer: Self-pay

## 2018-10-10 DIAGNOSIS — O42013 Preterm premature rupture of membranes, onset of labor within 24 hours of rupture, third trimester: Secondary | ICD-10-CM

## 2018-10-10 DIAGNOSIS — Z3A36 36 weeks gestation of pregnancy: Secondary | ICD-10-CM

## 2018-10-10 DIAGNOSIS — O1414 Severe pre-eclampsia complicating childbirth: Secondary | ICD-10-CM

## 2018-10-10 DIAGNOSIS — O1410 Severe pre-eclampsia, unspecified trimester: Secondary | ICD-10-CM | POA: Diagnosis not present

## 2018-10-10 LAB — CBC
HEMATOCRIT: 34.5 % — AB (ref 36.0–46.0)
HEMATOCRIT: 31.9 % — AB (ref 36.0–46.0)
HEMOGLOBIN: 10.7 g/dL — AB (ref 12.0–15.0)
Hemoglobin: 11.6 g/dL — ABNORMAL LOW (ref 12.0–15.0)
MCH: 29.3 pg (ref 26.0–34.0)
MCH: 29.1 pg (ref 26.0–34.0)
MCHC: 33.6 g/dL (ref 30.0–36.0)
MCHC: 33.5 g/dL (ref 30.0–36.0)
MCV: 87.1 fL (ref 80.0–100.0)
MCV: 86.7 fL (ref 80.0–100.0)
Platelets: 175 10*3/uL (ref 150–400)
Platelets: 164 10*3/uL (ref 150–400)
RBC: 3.96 MIL/uL (ref 3.87–5.11)
RBC: 3.68 MIL/uL — ABNORMAL LOW (ref 3.87–5.11)
RDW: 12.7 % (ref 11.5–15.5)
RDW: 12.8 % (ref 11.5–15.5)
WBC: 18.4 10*3/uL — ABNORMAL HIGH (ref 4.0–10.5)
WBC: 17.1 10*3/uL — ABNORMAL HIGH (ref 4.0–10.5)
nRBC: 0.1 % (ref 0.0–0.2)
nRBC: 0.2 % (ref 0.0–0.2)

## 2018-10-10 MED ORDER — DIBUCAINE 1 % RE OINT: 1.0000 "application " | TOPICAL_OINTMENT | RECTAL | Status: DC | PRN

## 2018-10-10 MED ORDER — ZOLPIDEM TARTRATE 5 MG PO TABS: 5.0000 mg | ORAL_TABLET | Freq: Every evening | ORAL | Status: DC | PRN

## 2018-10-10 MED ORDER — ACETAMINOPHEN 325 MG PO TABS: 650.0000 mg | ORAL_TABLET | ORAL | Status: DC | PRN

## 2018-10-10 MED ORDER — OXYCODONE-ACETAMINOPHEN 5-325 MG PO TABS: 1.0000 | ORAL_TABLET | ORAL | Status: DC | PRN

## 2018-10-10 MED ORDER — SIMETHICONE 80 MG PO CHEW: 80.0000 mg | CHEWABLE_TABLET | ORAL | Status: DC | PRN

## 2018-10-10 MED ORDER — SODIUM BICARBONATE 8.4 % IV SOLN
INTRAVENOUS | Status: DC | PRN
  Administered 2018-10-10: 3 mL via EPIDURAL
  Administered 2018-10-10: 4 mL via EPIDURAL

## 2018-10-10 MED ORDER — TETANUS-DIPHTH-ACELL PERTUSSIS 5-2.5-18.5 LF-MCG/0.5 IM SUSP: 0.5000 mL | Freq: Once | INTRAMUSCULAR | Status: DC

## 2018-10-10 MED ORDER — DIPHENHYDRAMINE HCL 25 MG PO CAPS: 25.0000 mg | ORAL_CAPSULE | Freq: Four times a day (QID) | ORAL | Status: DC | PRN

## 2018-10-10 MED ORDER — WITCH HAZEL-GLYCERIN EX PADS: 1.0000 "application " | MEDICATED_PAD | CUTANEOUS | Status: DC | PRN

## 2018-10-10 MED ORDER — BENZOCAINE-MENTHOL 20-0.5 % EX AERO: 1.0000 "application " | INHALATION_SPRAY | CUTANEOUS | Status: DC | PRN

## 2018-10-10 MED ORDER — OXYTOCIN 40 UNITS IN LACTATED RINGERS INFUSION - SIMPLE MED
1.0000 m[IU]/min | INTRAVENOUS | Status: DC
  Administered 2018-10-10: 2 m[IU]/min via INTRAVENOUS

## 2018-10-10 MED ORDER — PRENATAL MULTIVITAMIN CH
1.0000 | ORAL_TABLET | Freq: Every day | ORAL | Status: DC
  Administered 2018-10-11 – 2018-10-12 (×2): 1 via ORAL
  Filled 2018-10-10 (×2): qty 1

## 2018-10-10 MED ORDER — ONDANSETRON HCL 4 MG PO TABS: 4.0000 mg | ORAL_TABLET | ORAL | Status: DC | PRN

## 2018-10-10 MED ORDER — COCONUT OIL OIL: 1.0000 "application " | TOPICAL_OIL | Status: DC | PRN

## 2018-10-10 MED ORDER — LACTATED RINGERS IV SOLN
INTRAVENOUS | Status: DC
  Administered 2018-10-11 (×2): via INTRAVENOUS

## 2018-10-10 MED ORDER — IBUPROFEN 600 MG PO TABS
600.0000 mg | ORAL_TABLET | Freq: Four times a day (QID) | ORAL | Status: DC
  Administered 2018-10-10 – 2018-10-12 (×7): 600 mg via ORAL
  Filled 2018-10-10 (×7): qty 1

## 2018-10-10 MED ORDER — ONDANSETRON HCL 4 MG/2ML IJ SOLN: 4.0000 mg | INTRAMUSCULAR | Status: DC | PRN

## 2018-10-10 MED ORDER — TERBUTALINE SULFATE 1 MG/ML IJ SOLN: 0.2500 mg | Freq: Once | INTRAMUSCULAR | Status: DC | PRN

## 2018-10-10 MED ORDER — SENNOSIDES-DOCUSATE SODIUM 8.6-50 MG PO TABS
2.0000 | ORAL_TABLET | ORAL | Status: DC
  Administered 2018-10-10 – 2018-10-11 (×2): 2 via ORAL
  Filled 2018-10-10 (×2): qty 2

## 2018-10-10 MED ORDER — MAGNESIUM SULFATE 40 G IN LACTATED RINGERS - SIMPLE
2.0000 g/h | INTRAVENOUS | Status: AC
  Administered 2018-10-10: 2 g/h via INTRAVENOUS
  Filled 2018-10-10 (×2): qty 500

## 2018-10-10 NOTE — Anesthesia Procedure Notes (Signed)
Epidural Patient location during procedure: OB Start time: 10/10/2018 9:10 AM End time: 10/10/2018 9:25 AM  Staffing Anesthesiologist: Elmer Picker, MD Performed: anesthesiologist   Preanesthetic Checklist Completed: patient identified, pre-op evaluation, timeout performed, IV checked, risks and benefits discussed and monitors and equipment checked  Epidural Patient position: sitting Prep: site prepped and draped and DuraPrep Patient monitoring: continuous pulse ox, blood pressure, heart rate and cardiac monitor Approach: midline Location: L3-L4 Injection technique: LOR air  Needle:  Needle type: Tuohy  Needle gauge: 17 G Needle length: 9 cm Needle insertion depth: 5 cm Catheter type: closed end flexible Catheter size: 19 Gauge Catheter at skin depth: 10 cm Test dose: negative  Assessment Sensory level: T8 Events: blood not aspirated, injection not painful, no injection resistance, negative IV test and no paresthesia  Additional Notes Patient identified. Risks/Benefits/Options discussed with patient including but not limited to bleeding, infection, nerve damage, paralysis, failed block, incomplete pain control, headache, blood pressure changes, nausea, vomiting, reactions to medication both or allergic, itching and postpartum back pain. Confirmed with bedside nurse the patient's most recent platelet count. Confirmed with patient that they are not currently taking any anticoagulation, have any bleeding history or any family history of bleeding disorders. Patient expressed understanding and wished to proceed. All questions were answered. Sterile technique was used throughout the entire procedure. Please see nursing notes for vital signs. Test dose was given through epidural catheter and negative prior to continuing to dose epidural or start infusion. Warning signs of high block given to the patient including shortness of breath, tingling/numbness in hands, complete motor block,  or any concerning symptoms with instructions to call for help. Patient was given instructions on fall risk and not to get out of bed. All questions and concerns addressed with instructions to call with any issues or inadequate analgesia.  Reason for block:procedure for pain

## 2018-10-10 NOTE — Progress Notes (Signed)
LABOR PROGRESS NOTE  Tara [name] is a 22 y.o. G1P0 at 103w4d  admitted for IOL for PPROM  Subjective: Patient in good spirits. Family at bedside.   Objective: BP 128/81   Pulse 99   Temp (!) 97.4 F (36.3 C) (Oral)   Resp 16   Ht 4' 9.5" (1.461 m)   Wt 67.6 kg   LMP 02/02/2018   SpO2 100%   BMI 31.68 kg/m  or  Vitals:   10/10/18 0300 10/10/18 0400 10/10/18 0501 10/10/18 0609  BP: 126/77 132/69 (!) 118/58 128/81  Pulse: 98 (!) 102 95 99  Resp: 16 16 16 16   Temp:    (!) 97.4 F (36.3 C)  TempSrc:    Oral  SpO2:      Weight:      Height:        Dilation: 3 Effacement (%): 80 Cervical Position: Middle Station: -3 Presentation: Vertex Exam by:: Dr.Kalub Morillo FHT: baseline rate 130, moderate varibility, +acel, -decel Toco: irregular   Labs: Lab Results  Component Value Date   WBC 9.5 10/09/2018   HGB 11.2 (L) 10/09/2018   HCT 33.3 (L) 10/09/2018   MCV 86.5 10/09/2018   PLT 169 10/09/2018    Patient Active Problem List   Diagnosis Date Noted  . PROM (premature rupture of membranes) 10/09/2018  . Encounter for Nexplanon removal 11/25/2017    Assessment / Plan: 22 y.o. G1P0 at [redacted]w[redacted]d here for IOL for PPROM, now with severe preE  Labor: IOL, s/p cytotec x4. Will start pitocin  Fetal Wellbeing:  Cat 1 Pain Control:  Planning epidural Anticipated MOD:  NSVD PreE: Pr:Cr 0.97, BP 132/69>118/58. Mg  Oralia Manis, DO PGY-2 10/10/2018, 6:30 AM

## 2018-10-10 NOTE — Progress Notes (Signed)
Tara [name] is a 22 y.o. G1P0 at [redacted]w[redacted]d by admitted for induction of labor due to Pre-eclamptic toxemia of pregnancy..  Subjective: Patient resting on R side, repositioned to L side with peanut ball after cervical check.   Objective: BP 120/67   Pulse (!) 102   Temp 98.1 F (36.7 C) (Oral)   Resp 18   Ht 4' 9.5" (1.461 m)   Wt 67.6 kg   LMP 02/02/2018   SpO2 100%   BMI 31.68 kg/m  I/O last 3 completed shifts: In: 5245.9 [P.O.:600; I.V.:1929.2; IV Piggyback:2716.7] Out: 1600 [Urine:1600] Total I/O In: 2226.9 [P.O.:275; I.V.:551.9; IV Piggyback:1400] Out: 275 [Urine:275]  FHT:  FHR: 125 bpm, variability: moderate,  accelerations:  Present,  decelerations:  Absent UC:   regular, every 2-5 minutes SVE:   Dilation: 10 Effacement (%): 100 Station: Plus 1 Exam by:: Edd Arbour, SNM  Labs: Lab Results  Component Value Date   WBC 18.4 (H) 10/10/2018   HGB 11.6 (L) 10/10/2018   HCT 34.5 (L) 10/10/2018   MCV 87.1 10/10/2018   PLT 175 10/10/2018    Assessment / Plan: Induction of labor due to preeclampsia,  progressing well on pitocin. Will labor down on peanut ball.  Labor: Progressing normally Preeclampsia:  on magnesium sulfate Fetal Wellbeing:  Category I Pain Control:  Epidural I/D:  n/a Anticipated MOD:  NSVD  Kaleya Douse R Leandrea Ackley 10/10/2018, 1:01 PM

## 2018-10-10 NOTE — Progress Notes (Signed)
Tara [name] is a 22 y.o. G1P0 at [redacted]w[redacted]d by admitted for induction of labor due to PPROM.  Subjective: Patient resting with epidural, denies pain. CNM confirmed vertex presentation by bedside ultrasound.    Objective: BP 128/70   Pulse (!) 101   Temp 98.3 F (36.8 C) (Oral)   Resp 18   Ht 4' 9.5" (1.461 m)   Wt 67.6 kg   LMP 02/02/2018   SpO2 100%   BMI 31.68 kg/m  I/O last 3 completed shifts: In: 5245.9 [P.O.:600; I.V.:1929.2; IV Piggyback:2716.7] Out: 1600 [Urine:1600] Total I/O In: 25 [P.O.:25] Out: 150 [Urine:150]  FHT:  FHR: 130 bpm, variability: moderate,  accelerations:  Present,  decelerations:  Absent UC:   regular, every 3-5 minutes SVE:   Dilation: 5 Effacement (%): 70, 80 Station: -1 Exam by:: [employer], RN  Labs: Lab Results  Component Value Date   WBC 18.4 (H) 10/10/2018   HGB 11.6 (L) 10/10/2018   HCT 34.5 (L) 10/10/2018   MCV 87.1 10/10/2018   PLT 175 10/10/2018    Assessment / Plan: Spontaneous labor, progressing normally  Labor: Progressing normally on Pitocin Preeclampsia:  no signs or symptoms of toxicity Fetal Wellbeing:  Category I Pain Control:  Epidural I/D:  n/a Anticipated MOD:  NSVD  Bernerd Limbo 10/10/2018, 10:17 AM

## 2018-10-11 LAB — CULTURE, BETA STREP (GROUP B ONLY)
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
## 2018-10-11 NOTE — Lactation Note (Signed)
This note was copied from a baby's chart. Lactation Consultation Note  Patient Name: Tara [name] [id number] Date: 10/11/2018 Reason for consult: Follow-up assessment;Late-preterm 34-36.6wks  "Will" had cued to feed, so parents called me. We attempted feeding EBM via bottle with the Medela nipple that the parents had provided. Infant had already fallen asleep prior to bottle being put in mouth. He did suckle a few times on the nipple, but continued to sleep. The teat on the Medela nipple was noted to be soft. An Nfant purple nipple was obtained from the NICU and will be attempted at the next bottle feeding (which will be no later than 3 hrs after the feeding at 1150).   Lurline Hare [facility] 10/11/2018, 1:44 PM

## 2018-10-11 NOTE — Lactation Note (Signed)
This note was copied from a baby's chart. Lactation Consultation Note  Patient Name: Tara [name] ZOXWR'[id number] Date: 10/11/2018 Reason for consult: Follow-up assessment;Late-preterm 34-36.6wks  "Tara [name]" was able to drink 10 mL in 5 minutes with the NFant slow-flow nipple (parents reported that it took 30 minutes for infant to drink with their Playtex Ventaire & 10-15 min with the Medela nipple). Parents were pleased with the improvement. Parents understand that how to clean the nipple between uses (hand-out provided) and that a new nipple is required in 24 hours (a nipple for tomorrow has already been provided).  Mom had been using size 27 flanges, but her anatomy suggests size 24 flanges. We tried size 24 flanges & they seem appropriate for the time being.   Lurline Hare [facility] 10/11/2018, 3:33 PM

## 2018-10-11 NOTE — Lactation Note (Signed)
This note was copied from a baby's chart. Lactation Consultation Note  Patient Name: Tara [name] ZOXWR'U Date: 10/11/2018    Mom had been expressing her milk antenatally, but did not store it b/c she did not know that it could be used postpartum. Her EBM has the consistency of transitional milk instead of colostrum. I anticipate that she will quickly experience lactogenesis II.  Hand expression was taught to Mom & she was able to return demonstration successfully. Mom was able to pump 5mL on preemie setting, but was then able to hand express 5-6 mL.   Parents are to call me so I can observe bottle feeding.  LPI bassinet card was reviewed. Dad knows how to wash pump parts.   Lurline Hare [facility] 10/11/2018, 12:45 PM

## 2018-10-11 NOTE — Progress Notes (Signed)
Faculty Attending Note  Post Partum Day 1  Subjective: Patient is feeling well. She reports moderately well controlled pain on PO pain meds. She is ambulating and denies light-headedness or dizziness. She is passing flatus. She is tolerating a regular diet without nausea/vomiting. Bleeding is moderate. She is breast feeding. Baby is in room and doing well.  Objective: Blood pressure (!) 144/86, pulse 72, temperature 98.5 F (36.9 C), temperature source Oral, resp. rate 19, height 4' 9.5" (1.461 m), weight 67.6 kg, last menstrual period 02/02/2018, SpO2 100 %, unknown if currently breastfeeding. Temp:  [98.1 F (36.7 C)-99.3 F (37.4 C)] 98.5 F (36.9 C) (11/06 0757) Pulse Rate:  [64-116] 72 (11/06 0757) Resp:  [16-20] 19 (11/06 0757) BP: (110-147)/(61-93) 144/86 (11/06 0757) SpO2:  [98 %-100 %] 100 % (11/06 0757)  Physical Exam:  General: alert, oriented, cooperative Chest: CTAB, normal respiratory effort Heart: RRR  Abdomen: soft, appropriately tender to palpation  Uterine Fundus: firm, 2 fingers below the umbilicus Lochia: moderate, rubra DVT Evaluation: no evidence of DVT Extremities: no edema, no calf tenderness  UOP: adequate  Current Facility-Administered Medications:  .  acetaminophen (TYLENOL) tablet 650 mg, 650 mg, Oral, Q4H PRN, Stinson, Jacob J, DO .  benzocaine-Menthol (DERMOPLAST) 20-0.5 % topical spray 1 application, 1 application, Topical, PRN, Stinson, Jacob J, DO .  coconut oil, 1 application, Topical, PRN, Adrian Blackwater, Jacob J, DO .  witch hazel-glycerin (TUCKS) pad 1 application, 1 application, Topical, PRN **AND** dibucaine (NUPERCAINAL) 1 % rectal ointment 1 application, 1 application, Rectal, PRN, Levie Heritage, DO .  diphenhydrAMINE (BENADRYL) capsule 25 mg, 25 mg, Oral, Q6H PRN, Stinson, Jacob J, DO .  ibuprofen (ADVIL,MOTRIN) tablet 600 mg, 600 mg, Oral, Q6H, Levie Heritage, DO, 600 mg at 10/11/18 0555 .  lactated ringers infusion, , Intravenous,  Continuous, Levie Heritage, DO, Last Rate: 100 mL/hr at 10/11/18 0919 .  magnesium sulfate 40 grams in LR 500 mL OB infusion, 2 g/hr, Intravenous, Continuous, Levie Heritage, DO, Last Rate: 25 mL/hr at 10/11/18 0530, 2 g/hr at 10/11/18 0530 .  ondansetron (ZOFRAN) tablet 4 mg, 4 mg, Oral, Q4H PRN **OR** ondansetron (ZOFRAN) injection 4 mg, 4 mg, Intravenous, Q4H PRN, Stinson, Jacob J, DO .  oxyCODONE-acetaminophen (PERCOCET/ROXICET) 5-325 MG per tablet 1 tablet, 1 tablet, Oral, Q4H PRN, Levie Heritage, DO .  prenatal multivitamin tablet 1 tablet, 1 tablet, Oral, Q1200, Stinson, Jacob J, DO .  senna-docusate (Senokot-S) tablet 2 tablet, 2 tablet, Oral, Q24H, Levie Heritage, DO, 2 tablet at 10/10/18 2343 .  simethicone (MYLICON) chewable tablet 80 mg, 80 mg, Oral, PRN, Levie Heritage, DO .  Tdap (BOOSTRIX) injection 0.5 mL, 0.5 mL, Intramuscular, Once, Stinson, Jacob J, DO .  zolpidem (AMBIEN) tablet 5 mg, 5 mg, Oral, QHS PRN, Levie Heritage, DO Recent Labs    10/10/18 0816 10/10/18 1500  HGB 11.6* 10.7*  HCT 34.5* 31.9*    Assessment/Plan:  Patient is 22 y.o. G1P0101 PPD#1 s/p SVD at [redacted]w[redacted]d, h/o PPROM then developed pre-eclampsia with severe features. On MgSO4 x 24 hrs post partum. She is doing well, BP moderately well controlled, reviewed that we will start PO anti-hypertensives as needed. She is recovering appropriately and complains only of moderate bleeding.   Cont MgSO4 x 24 hrs Continue routine post partum care Pain meds prn Regular diet nexplanon for birth control    K. Therese Sarah, M.D. [facility]  10/11/2018, 11:21 AM

## 2018-10-11 NOTE — Lactation Note (Signed)
This note was copied from a baby's chart. Lactation Consultation Note Baby 11 hrs old not latching to breast. Mom has personal bottles that she pumped and collected 10 ml colostrum/transitional milk. Baby took 5 ml. Encouraged mom to burp baby and give the other 5ml. Mom has large breast w/short shaft nipples. Evert well w/stimulation. Mom has slight areola edema. Demonstrated reverse pressure and finger stimulation to soften nipple to make it more compressible to obtain a deeper latch. Mom stated understanding. Discussed deep verses shallow latch. Mom states she has been pumped some for the past 2 weeks. Mom states it wasn't on a schedule, when her breast got tight or hurting she pumped. Mom didn't save the colostrum. Stated it was to early from her due date and was afraid it wouldn't be good or what the baby needed. Discussed milk storage.  LC hand expressed 20 ml colostrum. LPI information sheet given and reviewed. LPI behavior, feeding habits, STS, I&O supply and demand, and supplementation discussed in depth. If mom doesn't have enough to supplement w/she is fine w/using formula. Praised mom for having such good flow of colostrum. Appears to be transitional milk. Has slight yellow tint in colostrum container, white coming out of breast.  Mom has DEBP at bedside and has used it when she collected 10 ml.  Mom states understanding of using pump. Mom has DEBP and hand pump at home. Mom stated she prefered pumping w/hand pump. Mom knows to pump q3h for 15-20 min. Mom encouraged to feed baby 8-12 times/24 hours and with feeding cues. Mom encouraged to waken baby for feeds. Mom holding baby STS. Encouraged to call for assistance or questions.  [facility] brochure given w/resources, support groups and LC services.  Patient Name: Tara [name] [id number] Date: 10/11/2018 Reason for consult: Initial assessment;Late-preterm 34-36.6wks;1st time breastfeeding;Infant < 6lbs   Maternal Data Has patient been  taught Hand Expression?: Yes Does the patient have breastfeeding experience prior to this delivery?: No  Feeding Feeding Type: Breast Fed  LATCH Score Latch: Too sleepy or reluctant, no latch achieved, no sucking elicited.     Type of Nipple: Everted at rest and after stimulation  Comfort (Breast/Nipple): Soft / non-tender        Interventions Interventions: Breast feeding basics reviewed;Support pillows;Position options;Skin to skin;Expressed milk;Breast massage;Hand express;Shells;Pre-pump if needed;Reverse pressure;DEBP;Breast compression  Lactation Tools Discussed/Used Tools: Shells;Pump Shell Type: Inverted Breast pump type: Double-Electric Breast Pump WIC Program: No Date initiated:: 10/10/18   Consult Status Consult Status: Follow-up Date: 10/11/18 Follow-up type: In-patient    [name], Diamond Nickel 10/11/2018, 1:29 AM

## 2018-10-11 NOTE — Anesthesia Postprocedure Evaluation (Signed)
Anesthesia Post Note  Patient: Tara [name]  Procedure(s) Performed: AN AD HOC LABOR EPIDURAL     Patient location during evaluation: [facility] Anesthesia Type: Epidural Level of consciousness: awake and alert Pain management: pain level controlled Vital Signs Assessment: post-procedure vital signs reviewed and stable Respiratory status: spontaneous breathing, nonlabored ventilation and respiratory function stable Cardiovascular status: stable Postop Assessment: no headache, no backache and epidural receding Anesthetic complications: no    Last Vitals:  Vitals:   10/11/18 0505 10/11/18 0603  BP:    Pulse:    Resp: 20 18  Temp:    SpO2:      Last Pain:  Vitals:   10/11/18 0730  TempSrc:   PainSc: 2    Pain Goal: Patients Stated Pain Goal: 3 (10/11/18 0730)               [name]

## 2018-10-12 MED ORDER — AMLODIPINE BESYLATE 10 MG PO TABS
10.0000 mg | ORAL_TABLET | Freq: Every day | ORAL | Status: DC
  Administered 2018-10-12: 10 mg via ORAL
  Filled 2018-10-12: qty 1

## 2018-10-12 MED ORDER — IBUPROFEN 600 MG PO TABS: 600.0000 mg | ORAL_TABLET | Freq: Four times a day (QID) | ORAL | 2 refills | Status: DC

## 2018-10-12 MED ORDER — AMLODIPINE BESYLATE 10 MG PO TABS: 10.0000 mg | ORAL_TABLET | Freq: Every day | ORAL | 2 refills | Status: DC

## 2018-10-12 NOTE — Discharge Instructions (Signed)
Preeclampsia and Eclampsia °Preeclampsia is a serious condition that develops only during pregnancy. It is also called toxemia of pregnancy. This condition causes high blood pressure along with other symptoms, such as swelling and headaches. These symptoms may develop as the condition gets worse. Preeclampsia may occur at 20 weeks of pregnancy or later. °Diagnosing and treating preeclampsia early is very important. If not treated early, it can cause serious problems for you and your baby. One problem it can lead to is eclampsia, which is a condition that causes muscle jerking or shaking (convulsions or seizures) in the mother. Delivering your baby is the best treatment for preeclampsia or eclampsia. Preeclampsia and eclampsia symptoms usually go away after your baby is born. °What are the causes? °The cause of preeclampsia is not known. °What increases the risk? °The following risk factors make you more likely to develop preeclampsia: °· Being pregnant for the first time. °· Having had preeclampsia during a past pregnancy. °· Having a family history of preeclampsia. °· Having high blood pressure. °· Being pregnant with twins or triplets. °· Being 35 or older. °· Being African-American. °· Having kidney disease or diabetes. °· Having medical conditions such as lupus or blood diseases. °· Being very overweight (obese). ° °What are the signs or symptoms? °The earliest signs of preeclampsia are: °· High blood pressure. °· Increased protein in your urine. Your health care provider will check for this at every visit before you give birth (prenatal visit). ° °Other symptoms that may develop as the condition gets worse include: °· Severe headaches. °· Sudden weight gain. °· Swelling of the hands, face, legs, and feet. °· Nausea and vomiting. °· Vision problems, such as blurred or double vision. °· Numbness in the face, arms, legs, and feet. °· Urinating less than usual. °· Dizziness. °· Slurred speech. °· Abdominal pain,  especially upper abdominal pain. °· Convulsions or seizures. ° °Symptoms generally go away after giving birth. °How is this diagnosed? °There are no screening tests for preeclampsia. Your health care provider will ask you about symptoms and check for signs of preeclampsia during your prenatal visits. You may also have tests that include: °· Urine tests. °· Blood tests. °· Checking your blood pressure. °· Monitoring your baby’s heart rate. °· Ultrasound. ° °How is this treated? °You and your health care provider will determine the treatment approach that is best for you. Treatment may include: °· Having more frequent prenatal exams to check for signs of preeclampsia, if you have an increased risk for preeclampsia. °· Bed rest. °· Reducing how much salt (sodium) you eat. °· Medicine to lower your blood pressure. °· Staying in the hospital, if your condition is severe. There, treatment will focus on controlling your blood pressure and the amount of fluids in your body (fluid retention). °· You may need to take medicine (magnesium sulfate) to prevent seizures. This medicine may be given as an injection or through an IV tube. °· Delivering your baby early, if your condition gets worse. You may have your labor started with medicine (induced), or you may have a cesarean delivery. ° °Follow these instructions at home: °Eating and drinking ° °· Drink enough fluid to keep your urine clear or pale yellow. °· Eat a healthy diet that is low in sodium. Do not add salt to your food. Check nutrition labels to see how much sodium a food or beverage contains. °· Avoid caffeine. °Lifestyle °· Do not use any products that contain nicotine or tobacco, such as cigarettes   and e-cigarettes. If you need help quitting, ask your health care provider. °· Do not use alcohol or drugs. °· Avoid stress as much as possible. Rest and get plenty of sleep. °General instructions °· Take over-the-counter and prescription medicines only as told by your  health care provider. °· When lying down, lie on your side. This keeps pressure off of your baby. °· When sitting or lying down, raise (elevate) your feet. Try putting some pillows underneath your lower legs. °· Exercise regularly. Ask your health care provider what kinds of exercise are best for you. °· Keep all follow-up and prenatal visits as told by your health care provider. This is important. °How is this prevented? °To prevent preeclampsia or eclampsia from developing during another pregnancy: °· Get proper medical care during pregnancy. Your health care provider may be able to prevent preeclampsia or diagnose and treat it early. °· Your health care provider may have you take a low-dose aspirin or a calcium supplement during your next pregnancy. °· You may have tests of your blood pressure and kidney function after giving birth. °· Maintain a healthy weight. Ask your health care provider for help managing weight gain during pregnancy. °· Work with your health care provider to manage any long-term (chronic) health conditions you have, such as diabetes or kidney problems. ° °Contact a health care provider if: °· You gain more weight than expected. °· You have headaches. °· You have nausea or vomiting. °· You have abdominal pain. °· You feel dizzy or light-headed. °Get help right away if: °· You develop sudden or severe swelling anywhere in your body. This usually happens in the legs. °· You gain 5 lbs (2.3 kg) or more during one week. °· You have severe: °? Abdominal pain. °? Headaches. °? Dizziness. °? Vision problems. °? Confusion. °? Nausea or vomiting. °· You have a seizure. °· You have trouble moving any part of your body. °· You develop numbness in any part of your body. °· You have trouble speaking. °· You have any abnormal bleeding. °· You pass out. °This information is not intended to replace advice given to you by your health care provider. Make sure you discuss any questions you have with your health  care provider. °Document Released: 11/19/2000 Document Revised: 07/20/2016 Document Reviewed: 06/28/2016 °Elsevier Interactive Patient Education © 2018 Else[employer] Inc[employer] °Vaginal Delivery, Care After °Refer to this sheet in the next few weeks. These instructions provide you with information about caring for yourself after vaginal delivery. Your health care provider may also give you more specific instructions. Your treatment has been planned according to current medical practices, but problems sometimes occur. Call your health care provider if you have any problems or questions. °What can I expect after the procedure? °After vaginal delivery, it is common to have: °· Some bleeding from your vagina. °· Soreness in your abdomen, your vagina, and the area of skin between your vaginal opening and your anus (perineum). °· Pelvic cramps. °· Fatigue. ° °Follow these instructions at home: °Medicines °· Take over-the-counter and prescription medicines only as told by your health care provider. °· If you were prescribed an antibiotic medicine, take it as told by your health care provider. Do not stop taking the antibiotic until it is finished. °Driving ° °· Do not drive or operate heavy machinery while taking prescription pain medicine. °· Do not drive for 24 hours if you received a sedative. °Lifestyle °· Do not drink alcohol. This is especially important if you are breastfeeding   or taking medicine to relieve pain. °· Do not use tobacco products, including cigarettes, chewing tobacco, or e-cigarettes. If you need help quitting, ask your health care provider. °Eating and drinking °· Drink at least 8 eight-ounce glasses of water every day unless you are told not to by your health care provider. If you choose to breastfeed your baby, you may need to drink more water than this. °· Eat high-fiber foods every day. These foods may help prevent or relieve constipation. High-fiber foods include: °? Whole grain cereals and  breads. °? Brown rice. °? Beans. °? Fresh fruits and vegetables. °Activity °· Return to your normal activities as told by your health care provider. Ask your health care provider what activities are safe for you. °· Rest as much as possible. Try to rest or take a nap when your baby is sleeping. °· Do not lift anything that is heavier than your baby or 10 lb (4.5 kg) until your health care provider says that it is safe. °· Talk with your health care provider about when you can engage in sexual activity. This may depend on your: °? Risk of infection. °? Rate of healing. °? Comfort and desire to engage in sexual activity. °Vaginal Care °· If you have an episiotomy or a vaginal tear, check the area every day for signs of infection. Check for: °? More redness, swelling, or pain. °? More fluid or blood. °? Warmth. °? Pus or a bad smell. °· Do not use tampons or douches until your health care provider says this is safe. °· Watch for any blood clots that may pass from your vagina. These may look like clumps of dark red, brown, or black discharge. °General instructions °· Keep your perineum clean and dry as told by your health care provider. °· Wear loose, comfortable clothing. °· Wipe from front to back when you use the toilet. °· Ask your health care provider if you can shower or take a bath. If you had an episiotomy or a perineal tear during labor and delivery, your health care provider may tell you not to take baths for a certain length of time. °· Wear a bra that supports your breasts and fits you well. °· If possible, have someone help you with household activities and help care for your baby for at least a few days after you leave the hospital. °· Keep all follow-up visits for you and your baby as told by your health care provider. This is important. °Contact a health care provider if: °· You have: °? Vaginal discharge that has a bad smell. °? Difficulty urinating. °? Pain when urinating. °? A sudden increase or  decrease in the frequency of your bowel movements. °? More redness, swelling, or pain around your episiotomy or vaginal tear. °? More fluid or blood coming from your episiotomy or vaginal tear. °? Pus or a bad smell coming from your episiotomy or vaginal tear. °? A fever. °? A rash. °? Little or no interest in activities you used to enjoy. °? Questions about caring for yourself or your baby. °· Your episiotomy or vaginal tear feels warm to the touch. °· Your episiotomy or vaginal tear is separating or does not appear to be healing. °· Your breasts are painful, hard, or turn red. °· You feel unusually sad or worried. °· You feel nauseous or you vomit. °· You pass large blood clots from your vagina. If you pass a blood clot from your vagina, save it to show to your   health care provider. Do not flush blood clots down the toilet without having your health care provider look at them. °· You urinate more than usual. °· You are dizzy or light-headed. °· You have not breastfed at all and you have not had a menstrual period for 12 weeks after delivery. °· You have stopped breastfeeding and you have not had a menstrual period for 12 weeks after you stopped breastfeeding. °Get help right away if: °· You have: °? Pain that does not go away or does not get better with medicine. °? Chest pain. °? Difficulty breathing. °? Blurred vision or spots in your vision. °? Thoughts about hurting yourself or your baby. °· You develop pain in your abdomen or in one of your legs. °· You develop a severe headache. °· You faint. °· You bleed from your vagina so much that you fill two sanitary pads in one hour. °This information is not intended to replace advice given to you by your health care provider. Make sure you discuss any questions you have with your health care provider. °Document Released: 11/19/2000 Document Revised: 05/05/2016 Document Reviewed: 12/07/2015 °Elsevier Interactive Patient Education © 2018 Elsevier Inc. ° °

## 2018-10-12 NOTE — Progress Notes (Signed)
Post Partum Day 2 Subjective: no complaints, up ad lib, voiding and tolerating PO  Objective: Blood pressure (!) 145/75, pulse 61, temperature 98.6 F (37 C), temperature source Oral, resp. rate 18, height 4' 9.5" (1.461 m), weight 67.6 kg, last menstrual period 02/02/2018, SpO2 100 %, unknown if currently breastfeeding.  Physical Exam:  General: alert, cooperative and no distress Lochia: appropriate Uterine Fundus: firm Incision:  DVT Evaluation: No evidence of DVT seen on physical exam.  Recent Labs    10/10/18 0816 10/10/18 1500  HGB 11.6* 10.7*  HCT 34.5* 31.9*    Assessment/Plan: Begin Norvasc for BP Re evaluate later today for discharge   LOS: 3 days   Lazaro Arms 10/12/2018, 7:46 AM

## 2018-10-12 NOTE — Discharge Summary (Signed)
Physician Discharge Summary  Patient ID: Tara [name] MRN: [id number] DOB/AGE: 02-21-1996 22 y.o.  Admit date: 10/09/2018 Discharge date: 10/12/2018   Discharge Diagnoses:  Principal Problem:   PROM (premature rupture of membranes) Active Problems:   Pre-eclampsia, severe   Hospital Course: Please see HPI dated 10/09/2018 for details. This is a 22 y.o. G1P0101 now PPD#2 from a spontaneous vaginal delivery. She presented with PPROM, subsequently developed pre-eclampsia with severe features. Her antepartum course was uncomplicated. Her post partum course was complicated by pre-eclampsia for which she received 24 hrs MgSO4 post partum. By day 2, she was ambulating, passing flatus and pain was well controlled. She was started on norvasc 10 mg on PPD#2 for elevated BP. She was discharged home PPD#2 in good condition. Baby remains one more night in hospital, will likely be discharged home tomorrow. She will have BP check in 3-4 days. Instructions for follow up given.    Physical exam  Vitals:   10/12/18 0539 10/12/18 0732 10/12/18 1208 10/12/18 1532  BP: (!) 140/94 (!) 145/75 132/88 117/64  Pulse: 67 61 77 77  Resp: 17 18 18 18   Temp: 98.5 F (36.9 C) 98.6 F (37 C) 98.6 F (37 C) 98.5 F (36.9 C)  TempSrc: Oral Oral Oral Oral  SpO2: 100% 100% 100% 100%  Weight:      Height:       Physical Exam:  General: alert, oriented, cooperative Chest: CTAB, normal respiratory effort Heart: RRR  Abdomen: soft, appropriately tender to palpation  Uterine Fundus: firm, 2 fingers below the umbilicus Lochia: moderate, rubra DVT Evaluation: no evidence of DVT Extremities: no edema, no calf tenderness   Postpartum contraception: Nexplanon  Disposition: home  Discharged Condition: good  Discharge Instructions    Call MD for:  difficulty breathing, headache or visual disturbances   Complete by:  As directed    Call MD for:  extreme fatigue   Complete by:  As directed    Call MD for:   hives   Complete by:  As directed    Call MD for:  persistant dizziness or light-headedness   Complete by:  As directed    Call MD for:  persistant nausea and vomiting   Complete by:  As directed    Call MD for:  redness, tenderness, or signs of infection (pain, swelling, redness, odor or green/yellow discharge around incision site)   Complete by:  As directed    Call MD for:  severe uncontrolled pain   Complete by:  As directed    Call MD for:  temperature >100.4   Complete by:  As directed    Diet - low sodium heart healthy   Complete by:  As directed      Allergies as of 10/12/2018   No Known Allergies     Medication List    TAKE these medications   amLODipine 10 MG tablet Commonly known as:  NORVASC Take 1 tablet (10 mg total) by mouth daily. Start taking on:  10/13/2018   ibuprofen 600 MG tablet Commonly known as:  ADVIL,MOTRIN Take 1 tablet (600 mg total) by mouth every 6 (six) hours.   PRENATAL VITAMIN PO Take 1 tablet by mouth daily.      Follow-up Information    [facility] Go on 10/17/2018.   Specialty:  Obstetrics and Gynecology Contact information: [address] [place] [address] [phone]          Signed: Baldemar Lenis, M.D. [facility] [employer]  10/12/2018, 4:51 PM

## 2018-10-12 NOTE — Progress Notes (Signed)
Pt discharged with printed instructions. Pt verbalized an understanding. No concerns noted. Desirea Mizrahi L Gerasimos Plotts, RN 

## 2018-10-12 NOTE — Lactation Note (Signed)
This note was copied from a baby's chart. Lactation Consultation Note  Patient Name: Boy Julitza Rickles [id number] Date: 10/12/2018 Reason for consult: Follow-up assessment;Late-preterm 34-36.6wks;Infant < 6lbs;1st time breastfeeding;Primapara   Follow up with mom of 42 hour old LPT infant. Infant with 6 bottles EBM of 5-25 ml, 3 bottles of formula of 15-21 cc, 5 voids and 3 stools in the last 24 hours. Infant weight 5 pounds 0.8 ounces with 5.5 % weight loss since birth. Infant is not latching to the breast at this time, mom is attempting and infant not interested.   Parents are concerned infant is awakening more often that 3 hours to feed, enc them to feed with feeding cues with no longer than 3 hours between feeds. Enc family to make sure infant gets at least 8 feeds in 24 hours. Infant is using the Nfant nipple and parents report he is doing well with that. Parents have Dr. Theora Gianotti bottles for home use. Discussed to try the preemie nipple. Discussed that if infant choking or drooling on the bottle, then the flow is too high.   Mom did not pump all night, she is really full this morning. Discussed importance of emptying breasts regularly to prevent engorgement and to provide milk for infant. Mom voiced understanding. Enc mom to pump with infant feeds with no longer that 3 hours between pumpings. Enc mom to allow herself 5 hours at night to sleep as needed unless her breasts are full and wake her up to pump. Reviewed supply and demand and engorgement care. Mom pumped 35 ml this morning, reviewed increasing volumes based on infant day of age and when infant wants.   Reviewed LPT infant feeding behavior. Enc mom to practice STS with infant and to offer the breast even if he does not latch. Reviewed that often near their due dates, infants tend to begin BF better. Enc uninterrupted rest for infant between feeds.   Reviewed I/O, Signs of dehydration in the infant, how to tell infant is getting enough at  the breast, engorgement prevention/treatment, and breast milk expression and storage.   [facility] Brochure reviewed, mom aware of OP services, BF Support Groups and LC phone #. Enc family to make sure infant has frequent weight checks. Enc mom to make OP appt near infants due date to assess transfer.   Mom has Medela PIS for home use, discussed how to use her tubing for the PIS. Storage bottles given.   Parents to call out for feeding assistance as needed. Parents report all questions/cocnerns have been answered at this time.    Maternal Data Has patient been taught Hand Expression?: Yes Does the patient have breastfeeding experience prior to this delivery?: No  Feeding Feeding Type: Bottle Fed - Formula Nipple Type: Nfant Slow Flow (purple)  LATCH Score                   Interventions    Lactation Tools Discussed/Used WIC Program: No Pump Review: Setup, frequency, and cleaning;Milk Storage Initiated by:: Reviewed and encouraged at least every 3 hours and when infant feeding   Consult Status Consult Status: Follow-up Date: 10/13/18 Follow-up type: In-patient    Silas Flood Sharonlee Nine 10/12/2018, 9:41 AM

## 2018-10-13 ENCOUNTER — Ambulatory Visit: Payer: Self-pay

## 2018-10-13 NOTE — Lactation Note (Addendum)
This note was copied from a baby's chart. Lactation Consultation Note  Patient Name: Tara [name] Date: 10/13/2018    Although the NFant nipple had done well in getting infant to bottle-feed, Dad was curious if a slightly-faster (but still slow-flow nipple) would be appropriate. We tried the Enfamil slow-fllow nipple & Will was able to comfortably drink 35 mL in 16 minutes. He soon cued for more, which was offered & he was satiated after a total feeding of 41 mL. Since parents also have Dr. Saul Fordyce bottles at home, I gave them a sheet that compared nipple flow rates among Dr. Saul Fordyce, Enfamil, & Nfant nipples so they can best determine the appropriate flow rate for what they have at home.   Mom's milk has come to volume & is now using a size 27 flange to pump. She is able to pump 60-3m/session. She has a Medela Pump-in -style at home. Parents were shown how to assemble & use hand pump (single- & double-mode) that was included in pump kit.   Mom was previously discharged and Will was kept as a baby patient. On Mom's discharge instructions, I noted that she had begun to take amlodipine 115m(L3). I shared with Mom that some studies have shown that amlodipine is safe while breastfeeding, while another study suggested that larger amounts than expected can be excreted into breast milk. I asked that Mom share this with the pediatrician when she sees him or her tomorrow & ask her OB if she could later be a candidate for labetalol or nifedipine. I reassured Mom, as per ThMarcello Mooresale's "Medications & Mother's Milk" (2019), "...use of this product should not deter a woman from breastfeeding if this medication is required."  I suggested that parents make an appt with MaDrema DallasRN[facility] IB[facility] also provided them with a nipple shield (size 24) in case that is needed while working with [facility].   RiMatthias [facility]1/07/2018, 11:35  AM

## 2018-10-13 NOTE — Lactation Note (Signed)
This note was copied from a baby's chart. Lactation Consultation Note  Patient Name: Tara [name] [id number] Date: 10/13/2018   [facility] consult attempted, but room is dark; will attempt consult at a later time. I asked Thayer Ohm, RN to remind parents that intake volumes should be higher (between 20-30 mL). RN has my # to call when parents awake.    Lurline Hare [facility] 10/13/2018, 7:58 AM

## 2018-10-17 ENCOUNTER — Ambulatory Visit (INDEPENDENT_AMBULATORY_CARE_PROVIDER_SITE_OTHER): Payer: Medicaid Other | Admitting: General Practice

## 2018-10-17 VITALS — BP 126/73 | HR 80 | Ht <= 58 in | Wt 131.0 lb

## 2018-10-17 DIAGNOSIS — Z0130 Encounter for examination of blood pressure without abnormal findings: Secondary | ICD-10-CM

## 2018-10-17 NOTE — Progress Notes (Addendum)
Patient presents to office today for BP check following delivery on 11/5 for pre-eclampsia. Patient reports taking norvasc daily. BP is WNL today. Per Dr Erin FullingHarraway Smith, patient should follow up in 4 weeks for pp visit.  Discussed follow up with patient and advised she continue BP medication. Patient verbalized understanding & would like to return here for pp visit. Patient had no questions.  Marylynn PearsonCarrie Harun Brumley RN BSN  10/17/18 @ 1030  Attestation of Attending Supervision of RN: Evaluation and management procedures were performed by the nurse under my supervision and collaboration.  I have reviewed the nursing note and chart, and I agree with the management and plan.  Carolyn L. Harraway-Smith, M.D., Evern CoreFACOG

## 2018-11-21 ENCOUNTER — Ambulatory Visit: Payer: Medicaid Other | Admitting: Obstetrics and Gynecology

## 2018-12-21 ENCOUNTER — Encounter: Payer: Self-pay | Admitting: Adult Health

## 2018-12-21 ENCOUNTER — Ambulatory Visit: Payer: Medicaid Other | Admitting: Adult Health

## 2018-12-21 VITALS — BP 117/76 | HR 84 | Ht <= 58 in | Wt 121.0 lb

## 2018-12-21 DIAGNOSIS — Z30017 Encounter for initial prescription of implantable subdermal contraceptive: Secondary | ICD-10-CM

## 2018-12-21 DIAGNOSIS — Z3202 Encounter for pregnancy test, result negative: Secondary | ICD-10-CM | POA: Diagnosis not present

## 2018-12-21 LAB — POCT URINE PREGNANCY: Preg Test, Ur: NEGATIVE

## 2018-12-21 NOTE — Progress Notes (Signed)
Patient ID: Tara [name], female   DOB: 1996/06/14, 23 y.o.   MRN: [id number] History of Present Illness:  Tara [name] is a 23 year old Hispanic female,married, G1P1 in to discuss getting nexplanon. She works at [employer] PCP is [facility].  Current Medications, Allergies, Past Medical History, Past Surgical History, Family History and Social History were reviewed in [employer] record.     Review of Systems: Patient denies any headaches, hearing loss, fatigue, blurred vision, shortness of breath, chest pain, abdominal pain, problems with bowel movements, urination, or intercourse. No joint pain or mood swings. Has 1 year old son.    Physical Exam:BP 117/76 (BP Location: Left Arm, Patient Position: Sitting, Cuff Size: Normal)   Pulse 84   Ht 4\' 9"  (1.448 m)   Wt 121 lb (54.9 kg)   LMP 12/17/2018   Breastfeeding No   BMI 26.18 kg/m   UPT negative. General:  Well developed, well nourished, no acute distress Skin:  Warm and dry Neck:  Midline trachea, normal thyroid, good ROM, no lymphadenopathy Lungs; Clear to auscultation bilaterally Cardiovascular: Regular rate and rhythm Psych:  No mood changes, alert and cooperative,seems happy Fall risk is low. Discussed nexplanon, and will order it today.  Impression: 1. Encounter for initial prescription of implantable subdermal contraceptive   2. Pregnancy examination or test, negative result       Plan: Order nexplanon  Return in about 4 weeks when on period, for nexplanon insertion, if not on period call to reschedule appt.

## 2019-01-18 ENCOUNTER — Ambulatory Visit (INDEPENDENT_AMBULATORY_CARE_PROVIDER_SITE_OTHER): Payer: Medicaid Other | Admitting: Adult Health

## 2019-01-18 ENCOUNTER — Encounter: Payer: Self-pay | Admitting: Adult Health

## 2019-01-18 VITALS — BP 108/74 | HR 76 | Ht <= 58 in | Wt 122.5 lb

## 2019-01-18 DIAGNOSIS — Z3202 Encounter for pregnancy test, result negative: Secondary | ICD-10-CM | POA: Insufficient documentation

## 2019-01-18 DIAGNOSIS — Z3049 Encounter for surveillance of other contraceptives: Secondary | ICD-10-CM

## 2019-01-18 DIAGNOSIS — Z30017 Encounter for initial prescription of implantable subdermal contraceptive: Secondary | ICD-10-CM | POA: Insufficient documentation

## 2019-01-18 LAB — POCT URINE PREGNANCY: Preg Test, Ur: NEGATIVE

## 2019-01-18 MED ORDER — ETONOGESTREL 68 MG ~~LOC~~ IMPL
68.0000 mg | DRUG_IMPLANT | Freq: Once | SUBCUTANEOUS | Status: AC
  Administered 2019-01-18: 68 mg via SUBCUTANEOUS

## 2019-01-18 NOTE — Patient Instructions (Signed)
Use condoms x 2 weeks, keep clean and dry x 24 hours, no heavy lifting, keep steri strips on x 72 hours, Keep pressure dressing on x 24 hours. Follow up prn problems.  

## 2019-01-18 NOTE — Progress Notes (Signed)
Patient ID: Tara [name], female   DOB: Sep 25, 1996, 23 y.o.   MRN: [id number] History of Present Illness: Tara [name] is a 23 year old Hispanic female, married, G1P0101 in for nexplanon insertion. PCP is [facility].   Current Medications, Allergies, Past Medical History, Past Surgical History, Family History and Social History were reviewed in [employer] record.     Review of Systems:  For nexplanon insertion    Physical Exam:BP 108/74 (BP Location: Left Arm, Patient Position: Sitting, Cuff Size: Normal)   Pulse 76   Ht 4' 9.5" (1.461 m)   Wt 122 lb 8 oz (55.6 kg)   LMP 01/14/2019   Breastfeeding No   BMI 26.05 kg/m UPT is negative. General:  Well developed, well nourished, no acute distress Skin:  Warm and dry Psych:  No mood changes, alert and cooperative,seems happy Consent signed, time out called. Left arm cleansed with betadine, and injected with 1.0 cc 2% lidocaine and waited til numb. Nexplanon easily inserted and steri strips applied.Rod easily palpated by provider and pt. Pressure dressing applied.  Impression: Nexplanon insertion lot # [id number], exp [id number]     Plan:  Use condoms x 2 weeks, keep clean and dry x 24 hours, no heavy lifting, keep steri strips on x 72 hours, Keep pressure dressing on x 24 hours. Follow up prn problems. Remove in 3 years

## 2019-01-18 NOTE — Addendum Note (Signed)
Addended by: Colen Darling on: 01/18/2019 12:34 PM   Modules accepted: Orders

## 2019-01-22 ENCOUNTER — Encounter: Payer: Medicaid Other | Admitting: Adult Health

## 2022-01-14 ENCOUNTER — Encounter: Payer: Self-pay | Admitting: Adult Health

## 2022-01-14 ENCOUNTER — Other Ambulatory Visit: Payer: Self-pay

## 2022-01-14 ENCOUNTER — Ambulatory Visit: Payer: Medicaid Other | Admitting: Adult Health

## 2022-01-14 VITALS — BP 102/67 | HR 95 | Ht <= 58 in | Wt 107.0 lb

## 2022-01-14 DIAGNOSIS — Z3046 Encounter for surveillance of implantable subdermal contraceptive: Secondary | ICD-10-CM | POA: Diagnosis not present

## 2022-01-14 DIAGNOSIS — Z3202 Encounter for pregnancy test, result negative: Secondary | ICD-10-CM | POA: Insufficient documentation

## 2022-01-14 DIAGNOSIS — Z113 Encounter for screening for infections with a predominantly sexual mode of transmission: Secondary | ICD-10-CM | POA: Insufficient documentation

## 2022-01-14 LAB — POCT URINE PREGNANCY: Preg Test, Ur: NEGATIVE

## 2022-01-14 MED ORDER — ETONOGESTREL 68 MG ~~LOC~~ IMPL
68.0000 mg | DRUG_IMPLANT | Freq: Once | SUBCUTANEOUS | Status: AC
  Administered 2022-01-14: 68 mg via SUBCUTANEOUS

## 2022-01-14 NOTE — Patient Instructions (Signed)
Use condoms x 2 weeks, keep clean and dry x 24 hours, no heavy lifting, keep steri strips on x 72 hours, Keep pressure dressing on x 24 hours. Follow up prn problems.  

## 2022-01-14 NOTE — Progress Notes (Signed)
°  Subjective:     Patient ID: Tara [name], female   DOB: 09-29-1996, 26 y.o.   MRN: [id number]  HPI [name] is a 26 year old female, married, G1P1 in for nexplanon removal and reinsertion. PCP is [facility]  Review of Systems   For nexplanon removal and reinsertion Has had irregular bleeding last 2 months, will bleed, stop and start again  Reviewed past medical,surgical, social and family history. Reviewed medications and allergies.  Objective:   Physical Exam BP 102/67 (BP Location: Right Arm, Patient Position: Sitting, Cuff Size: Normal)    Pulse 95    Ht 4' 9.5" (1.461 m)    Wt 107 lb (48.5 kg)    LMP  (LMP Unknown) Comment: irregular cycles   BMI 22.75 kg/m  UPT is negative. Consent signed and time out called.  Left arm cleansed with betadine, and injected with 1.5 cc 2% lidocaine and waited til numb.Under sterile technique a #11 blade was used to make small vertical incision, and a curved forceps was used to easily remove rod. Then new rod easily inserted, and palpated by provider and pt. Steri strips applied. Pressure dressing applied.      Upstream - 01/14/22 1517       Pregnancy Intention Screening   Does the patient want to become pregnant in the next year? No    Does the patient's partner want to become pregnant in the next year? No    Would the patient [name] to discuss contraceptive options today? No      Contraception Wrap Up   Current Method Hormonal Implant    End Method Hormonal Implant    Contraception Counseling Provided No             Assessment:     1. Pregnancy test negative  2. Screen for STD (sexually transmitted disease) Urine sent for GC/CHL  3. Encounter for removal and reinsertion of Nexplanon Lot [id number] Exp [id number]   Use condoms x 2 weeks, keep clean and dry x 24 hours, no heavy lifting, keep steri strips on x 72 hours, Keep pressure dressing on x 24 hours. Follow up prn problems.  Plan:     Return in about 12 days for pap and physical

## 2022-01-16 LAB — GC/CHLAMYDIA PROBE AMP
Chlamydia trachomatis, NAA: NEGATIVE
Neisseria Gonorrhoeae by PCR: NEGATIVE

## 2022-01-26 ENCOUNTER — Other Ambulatory Visit (HOSPITAL_COMMUNITY)
Admission: RE | Admit: 2022-01-26 | Discharge: 2022-01-26 | Disposition: A | Discharge status: Home / Self Care | Payer: Medicaid Other | Source: Ambulatory Visit | Attending: Adult Health | Admitting: Adult Health

## 2022-01-26 ENCOUNTER — Encounter: Payer: Self-pay | Admitting: Adult Health

## 2022-01-26 ENCOUNTER — Ambulatory Visit (INDEPENDENT_AMBULATORY_CARE_PROVIDER_SITE_OTHER): Payer: Medicaid Other | Admitting: Adult Health

## 2022-01-26 ENCOUNTER — Other Ambulatory Visit: Payer: Self-pay

## 2022-01-26 VITALS — BP 111/72 | HR 92 | Ht 58.25 in | Wt 104.0 lb

## 2022-01-26 DIAGNOSIS — Z975 Presence of (intrauterine) contraceptive device: Secondary | ICD-10-CM | POA: Diagnosis not present

## 2022-01-26 DIAGNOSIS — Z01419 Encounter for gynecological examination (general) (routine) without abnormal findings: Secondary | ICD-10-CM | POA: Insufficient documentation

## 2022-01-26 DIAGNOSIS — Z0000 Encounter for general adult medical examination without abnormal findings: Secondary | ICD-10-CM | POA: Diagnosis present

## 2022-01-26 NOTE — Progress Notes (Signed)
Patient ID: Tara [name], female   DOB: 21-Aug-1996, 26 y.o.   MRN: [id number] History of Present Illness: Tara [name] is a 26 year old female,married, G1P0101, in for a well woman gyn exam and pap. PCP is [facility].   Current Medications, Allergies, Past Medical History, Past Surgical History, Family History and Social History were reviewed in [employer] record.     Review of Systems:  Patient denies any headaches, hearing loss, fatigue, blurred vision, shortness of breath, chest pain, abdominal pain, problems with bowel movements, urination, or intercourse. No joint pain or mood swings.    Physical Exam:BP 111/72 (BP Location: Left Arm, Patient Position: Sitting, Cuff Size: Normal)    Pulse 92    Ht 4' 10.25" (1.48 m)    Wt 104 lb (47.2 kg)    LMP 01/07/2022 (Approximate) Comment: irregular cycles   BMI 21.55 kg/m   General:  Well developed, well nourished, no acute distress Skin:  Warm and dry Neck:  Midline trachea, normal thyroid, good ROM, no lymphadenopathy Lungs; Clear to auscultation bilaterally Breast:  No dominant palpable mass, retraction, or nipple discharge Cardiovascular: Regular rate and rhythm Abdomen:  Soft, non tender, no hepatosplenomegaly Pelvic:  External genitalia is normal in appearance, no lesions.  The vagina is normal in appearance. Urethra has no lesions or masses. The cervix is smooth and  bulbous.Pap with GC/CHL and HR HPV genotyping performed.  Uterus is felt to be normal size, shape, and contour.  No adnexal masses or tenderness noted.Bladder is non tender, no masses felt. Rectal: Deferred Extremities/musculoskeletal:  No swelling or varicosities noted, no clubbing or cyanosis Psych:  No mood changes, alert and cooperative,seems happy AA is 3 Fall risk is low Depression screen [facility] 2/9 01/26/2022 01/14/2022  Decreased Interest 1 0  Down, Depressed, Hopeless 1 0  PHQ - 2 Score 2 0  Altered sleeping 1 -  Tired, decreased energy 1 -  Change  in appetite 1 -  Feeling bad or failure about yourself  1 -  Trouble concentrating 0 -  Moving slowly or fidgety/restless 0 -  Suicidal thoughts 0 -  PHQ-9 Score 6 -    GAD 7 : Generalized Anxiety Score 01/26/2022  Nervous, Anxious, on Edge 1  Control/stop worrying 0  Worry too much - different things 0  Trouble relaxing 0  Restless 0  Easily annoyed or irritable 1  Afraid - awful might happen 0  Total GAD 7 Score 2      Upstream - 01/26/22 1430       Pregnancy Intention Screening   Does the patient want to become pregnant in the next year? No    Does the patient's partner want to become pregnant in the next year? No    Would the patient like to discuss contraceptive options today? No      Contraception Wrap Up   Current Method Hormonal Implant    End Method Hormonal Implant    Contraception Counseling Provided No             Examination chaperoned by Malachy Mood LPN  Impression and Plan: 1. Routine general medical examination at a health care facility Pap sent Physical in 1 year Pap in 3 if normal  2. Encounter for gynecological examination with Papanicolaou smear of cervix Pap sent   3. Nexplanon in place Placed 01/14/22, left arm

## 2022-01-29 LAB — CYTOLOGY - PAP
Adequacy: ABSENT
Chlamydia: NEGATIVE
Comment: NEGATIVE
Comment: NEGATIVE
Comment: NORMAL
Diagnosis: NEGATIVE
High risk HPV: NEGATIVE
Neisseria Gonorrhea: NEGATIVE

## 2022-09-08 ENCOUNTER — Encounter: Payer: Self-pay | Admitting: Obstetrics & Gynecology

## 2022-09-08 ENCOUNTER — Ambulatory Visit: Payer: Medicaid Other | Admitting: Obstetrics & Gynecology

## 2022-09-08 VITALS — BP 110/68 | HR 92 | Ht 58.25 in | Wt 109.0 lb

## 2022-09-08 DIAGNOSIS — Z3046 Encounter for surveillance of implantable subdermal contraceptive: Secondary | ICD-10-CM

## 2022-09-08 DIAGNOSIS — Z3009 Encounter for other general counseling and advice on contraception: Secondary | ICD-10-CM

## 2022-09-08 NOTE — Progress Notes (Signed)
   GYN VISIT Patient name: Tara [name] MRN [id number]  Date of birth: 03/26/1996 Chief Complaint:   Contraception (Remove nexplanon)  History of Present Illness:   Tara [name] is a 26 y.o. G65P0101 female being seen today for Nexplanon removal.  She desires a future pregnancy.  No issue with Nexplanon.  She does have periods with this form of contraception.    She otherwise reports no acute complaints    No LMP recorded. Patient has had an implant.     01/26/2022    2:23 PM 01/14/2022    3:17 PM  Depression screen PHQ 2/9  Decreased Interest 1 0  Down, Depressed, Hopeless 1 0  PHQ - 2 Score 2 0  Altered sleeping 1   Tired, decreased energy 1   Change in appetite 1   Feeling bad or failure about yourself  1   Trouble concentrating 0   Moving slowly or fidgety/restless 0   Suicidal thoughts 0   PHQ-9 Score 6      Review of Systems:   Pertinent items are noted in HPI Denies fever/chills, dizziness, headaches, visual disturbances, fatigue, shortness of breath, chest pain, abdominal pain, vomiting, no problems with periods, bowel movements, urination, or intercourse unless otherwise stated above.  Pertinent History Reviewed:  Reviewed past medical,surgical, social, obstetrical and family history.  Reviewed problem list, medications and allergies. Physical Assessment:   Vitals:   09/08/22 1054  BP: 110/68  Pulse: 92  Weight: 109 lb (49.4 kg)  Height: 4' 10.25" (1.48 m)  Body mass index is 22.59 kg/m.       Physical Examination:   General appearance: alert, well appearing, and in no distress  Psych: mood appropriate, normal affect  Skin: warm & dry   Cardiovascular: normal heart rate noted  Respiratory: normal respiratory effort, no distress  Ext: left arm with palpable device   NEXPLANON REMOVAL    Risks/benefits/side effects of Nexplanon have been discussed and her questions have been answered.  Specifically, a failure rate of 12/998 has been reported, with  an increased failure rate if pt takes [place] and/or antiseizure medicaitons.  She is aware of the common side effect of irregular bleeding, which the incidence of decreases over time. Signed copy of informed consent in chart.    Nexplanon site identified.  Area prepped in usual sterile fashon. Two cc's of 2% lidocaine was used to anesthetize the area. A small stab incision was made right beside the implant on the distal portion.  The Nexplanon rod was grasped using hemostats and removed intact without difficulty.  Steri-strips and a pressure bandage was applied.  There was less than 3 cc blood loss. There were no complications.  The patient tolerated the procedure well.   Chaperone: N/A    Assessment & Plan:  1) Pregnancy planning -encouraged PNV daily -discussed healthy lifestyle -call once positive pregnancy  2) Nexplanon removed without difficulty -see above procedure note   No orders of the defined types were placed in this encounter.   Return in about 1 year (around 09/09/2023) for Annual.   Janyth Pupa, DO Attending [place], [facility] for [facility], [place]

## 2022-11-11 ENCOUNTER — Ambulatory Visit (INDEPENDENT_AMBULATORY_CARE_PROVIDER_SITE_OTHER): Payer: Medicaid Other | Admitting: *Deleted

## 2022-11-11 VITALS — BP 125/69 | HR 109

## 2022-11-11 DIAGNOSIS — Z3201 Encounter for pregnancy test, result positive: Secondary | ICD-10-CM

## 2022-11-11 LAB — POCT URINE PREGNANCY: Preg Test, Ur: POSITIVE — AB

## 2022-11-11 NOTE — Progress Notes (Addendum)
   NURSE VISIT- PREGNANCY CONFIRMATION   SUBJECTIVE:  Tara [name] is a 26 y.o. G38P0101 female at Unknown by uncertain LMP of No LMP recorded (lmp unknown). Patient is pregnant. Here for pregnancy confirmation.  Home pregnancy test: positive x 2   She reports no complaints.  She is taking prenatal vitamins.    OBJECTIVE:  BP 125/69 (BP Location: Right Arm, Patient Position: Sitting, Cuff Size: Normal)   Pulse (!) 109   LMP  (LMP Unknown) Comment: irregular cycles  Appears well, in no apparent distress  Results for orders placed or performed in visit on 11/11/22 (from the past 24 hour(s))  POCT urine pregnancy   Collection Time: 11/11/22  4:10 PM  Result Value Ref Range   Preg Test, Ur Positive (A) Negative    ASSESSMENT: Positive pregnancy test, Unknown by LMP    PLAN: Schedule for dating ultrasound in pending hcg results days Prenatal vitamins: continue   Nausea medicines: not currently needed   OB packet given: Yes  Annamarie Dawley  11/11/2022  Chart reviewed for nurse visit. Agree with plan of care.  Jacklyn Shell, [place] 11/11/2022 6:14 PM   4:16 PM

## 2022-11-12 ENCOUNTER — Telehealth: Payer: Self-pay | Admitting: *Deleted

## 2022-11-12 LAB — BETA HCG QUANT (REF LAB): hCG Quant: 13316 m[IU]/mL

## 2022-11-12 NOTE — Telephone Encounter (Signed)
Mychart message to patient with hcg results.

## 2022-12-06 NOTE — L&D Delivery Note (Signed)
OB/GYN Faculty Practice Delivery Note  Tara [name] is a 27 y.o. G2P0101 s/p SVD at [redacted]w[redacted]d. She was admitted for eIOL.   ROM: 11h 60m with clear fluid GBS Status: negative Maximum Maternal Temperature: 98.9  Delivery Date/Time: 07/11/23 @1441  Delivery: Present to the room for scheduled check patient was complete. Started pushing. Head delivered ROA. Two loose nuchal cord present, reduced at the perineum. Shoulder and body delivered in usual fashion. Infant with spontaneous cry, placed on mother's abdomen, dried and stimulated. Cord clamped x 2 after 1-minute delay, and cut by FOB under my direct supervision. Cord blood drawn. Placenta delivered spontaneously with gentle cord traction. Fundus firm with massage and Pitocin. Labia, perineum, vagina, and cervix were inspected, and found to be intact.   Placenta: complete, three vessel cord appreciated Complications: none Lacerations: none EBL: 202 mL Analgesia: epidural  Postpartum Planning [x]  message to sent to schedule follow-up  [x]  vaccines UTD  Infant: Girl  APGARs 8/9  not yet weighed  Wyn Forster, MD [facility] for [facility], [facility]

## 2022-12-07 ENCOUNTER — Other Ambulatory Visit: Payer: Self-pay | Admitting: Obstetrics & Gynecology

## 2022-12-07 DIAGNOSIS — O3680X0 Pregnancy with inconclusive fetal viability, not applicable or unspecified: Secondary | ICD-10-CM

## 2022-12-08 ENCOUNTER — Encounter: Payer: Medicaid Other | Admitting: *Deleted

## 2022-12-08 ENCOUNTER — Ambulatory Visit (INDEPENDENT_AMBULATORY_CARE_PROVIDER_SITE_OTHER): Payer: Medicaid Other

## 2022-12-08 ENCOUNTER — Encounter: Payer: Medicaid Other | Admitting: Family Medicine

## 2022-12-08 DIAGNOSIS — Z3A09 9 weeks gestation of pregnancy: Secondary | ICD-10-CM

## 2022-12-08 DIAGNOSIS — O3680X0 Pregnancy with inconclusive fetal viability, not applicable or unspecified: Secondary | ICD-10-CM

## 2022-12-08 NOTE — Progress Notes (Signed)
[place] 9+4 wks,single IUP with yolk sac,CRL 27.02 mm,normal ovaries,FHR 167 bpm

## 2022-12-31 ENCOUNTER — Other Ambulatory Visit: Payer: Self-pay | Admitting: Obstetrics & Gynecology

## 2022-12-31 DIAGNOSIS — Z3682 Encounter for antenatal screening for nuchal translucency: Secondary | ICD-10-CM

## 2023-01-03 ENCOUNTER — Ambulatory Visit (INDEPENDENT_AMBULATORY_CARE_PROVIDER_SITE_OTHER): Payer: Medicaid Other

## 2023-01-03 ENCOUNTER — Encounter: Payer: Medicaid Other | Admitting: *Deleted

## 2023-01-03 ENCOUNTER — Ambulatory Visit (INDEPENDENT_AMBULATORY_CARE_PROVIDER_SITE_OTHER): Payer: Medicaid Other | Admitting: Women's Health

## 2023-01-03 ENCOUNTER — Encounter: Payer: Self-pay | Admitting: Women's Health

## 2023-01-03 VITALS — BP 108/66 | HR 77 | Wt 114.0 lb

## 2023-01-03 DIAGNOSIS — Z3480 Encounter for supervision of other normal pregnancy, unspecified trimester: Secondary | ICD-10-CM

## 2023-01-03 DIAGNOSIS — Z8751 Personal history of pre-term labor: Secondary | ICD-10-CM

## 2023-01-03 DIAGNOSIS — Z3682 Encounter for antenatal screening for nuchal translucency: Secondary | ICD-10-CM | POA: Diagnosis not present

## 2023-01-03 DIAGNOSIS — Z3A13 13 weeks gestation of pregnancy: Secondary | ICD-10-CM | POA: Diagnosis not present

## 2023-01-03 DIAGNOSIS — Z3490 Encounter for supervision of normal pregnancy, unspecified, unspecified trimester: Secondary | ICD-10-CM | POA: Insufficient documentation

## 2023-01-03 DIAGNOSIS — O09291 Supervision of pregnancy with other poor reproductive or obstetric history, first trimester: Secondary | ICD-10-CM | POA: Diagnosis not present

## 2023-01-03 DIAGNOSIS — O09299 Supervision of pregnancy with other poor reproductive or obstetric history, unspecified trimester: Secondary | ICD-10-CM

## 2023-01-03 DIAGNOSIS — Z3481 Encounter for supervision of other normal pregnancy, first trimester: Secondary | ICD-10-CM

## 2023-01-03 DIAGNOSIS — F17200 Nicotine dependence, unspecified, uncomplicated: Secondary | ICD-10-CM

## 2023-01-03 HISTORY — DX: Supervision of pregnancy with other poor reproductive or obstetric history, unspecified trimester: O09.299

## 2023-01-03 MED ORDER — ASPIRIN 81 MG PO TBEC: 162.0000 mg | DELAYED_RELEASE_TABLET | Freq: Every day | ORAL | 2 refills | Status: DC

## 2023-01-03 MED ORDER — PROGESTERONE 200 MG PO CAPS: 200.0000 mg | ORAL_CAPSULE | Freq: Every day | ORAL | 5 refills | Status: DC

## 2023-01-03 MED ORDER — DOXYLAMINE-PYRIDOXINE 10-10 MG PO TBEC: DELAYED_RELEASE_TABLET | ORAL | 6 refills | Status: DC

## 2023-01-03 NOTE — Progress Notes (Signed)
INITIAL OBSTETRICAL VISIT Patient name: Tara [name] MRN [id number]  Date of birth: January 24, 1996 Chief Complaint:   Initial Prenatal Visit (nausea)  History of Present Illness:   Tara [name] is a 27 y.o. G27P0101 Hispanic female at [redacted]w[redacted]d by [place] at 9 weeks with an Estimated Date of Delivery: 07/09/23 being seen today for her initial obstetrical visit.   No LMP recorded. Patient is pregnant. Her obstetrical history is significant for  36.5wk PTB d/t PPROM, severe pre-e dx intrapartum, PPHTN requiring meds .   Today she reports N/V, requests meds Smoker- 7cig/day prior to pregnancy, now 1/day- makes her sick Last pap 01/26/22. Results were: NILM w/ HRHPV negative     01/03/2023    3:35 PM 01/26/2022    2:23 PM 01/14/2022    3:17 PM  Depression screen PHQ 2/9  Decreased Interest 1 1 0  Down, Depressed, Hopeless 0 1 0  PHQ - 2 Score 1 2 0  Altered sleeping 2 1   Tired, decreased energy 1 1   Change in appetite 1 1   Feeling bad or failure about yourself  0 1   Trouble concentrating 0 0   Moving slowly or fidgety/restless 0 0   Suicidal thoughts 0 0   PHQ-9 Score 5 6         01/03/2023    3:35 PM 01/26/2022    2:23 PM  GAD 7 : Generalized Anxiety Score  Nervous, Anxious, on Edge 1 1  Control/stop worrying 1 0  Worry too much - different things 1 0  Trouble relaxing 0 0  Restless 0 0  Easily annoyed or irritable 2 1  Afraid - awful might happen 0 0  Total GAD 7 Score 5 2     Review of Systems:   Pertinent items are noted in HPI Denies cramping/contractions, leakage of fluid, vaginal bleeding, abnormal vaginal discharge w/ itching/odor/irritation, headaches, visual changes, shortness of breath, chest pain, abdominal pain, severe nausea/vomiting, or problems with urination or bowel movements unless otherwise stated above.  Pertinent History Reviewed:  Reviewed past medical,surgical, social, obstetrical and family history.  Reviewed problem list, medications and  allergies. OB History  Gravida Para Term Preterm AB Living  2 1   1   1   SAB IAB Ectopic Multiple Live Births        0 1    # Outcome Date GA Lbr Len/2nd Weight Sex Delivery Anes PTL Lv  2 Current           1 Preterm 10/10/18 [redacted]w[redacted]d 03:59 / 01:23 5 lb 5.5 oz (2.425 kg) M Vag-Spont EPI N LIV   Physical Assessment:   Vitals:   01/03/23 1533  BP: 108/66  Pulse: 77  Weight: 114 lb (51.7 kg)  Body mass index is 23.62 kg/m.       Physical Examination:  General appearance - well appearing, and in no distress  Mental status - alert, oriented to person, place, and time  Psych:  She has a normal mood and affect  Skin - warm and dry, normal color, no suspicious lesions noted  Chest - effort normal, all lung fields clear to auscultation bilaterally  Heart - normal rate and regular rhythm  Abdomen - soft, nontender  Extremities:  No swelling or varicosities noted  Thin prep pap is not done   Chaperone: N/A    TODAY'S NT [place] 13+2 wks,measurements c/w dates,NB present,NT 1.8 mm,normal ovaries,anterior placenta,FHR 158 bpm,CRL 69.66 mm   No results found for  this or any previous visit (from the past 24 hour(s)).  Assessment & Plan:  1) Low-Risk Pregnancy G2P0101 at [redacted]w[redacted]d with an Estimated Date of Delivery: 07/09/23   2) Initial OB visit  3) H/O 36.5wk PTB d/t PPROM> offered prometrium, wants, rx sent  4) H/O severe pre-e & PPHTN> ASA 162mg , baseline labs today  5) N/V> rx diclegis  6) Smoker> down to 1 cigarette/day (from 7/day), makes her sick. Advised complete cessation  Meds:  Meds ordered this encounter  Medications   aspirin EC 81 MG tablet    Sig: Take 2 tablets (162 mg total) by mouth daily. Swallow whole.    Dispense:  180 tablet    Refill:  2   Doxylamine-Pyridoxine (DICLEGIS) 10-10 MG TBEC    Sig: 2 tabs q hs, if sx persist add 1 tab q am on day 3, if sx persist add 1 tab q afternoon on day 4    Dispense:  100 tablet    Refill:  6   progesterone (PROMETRIUM) 200 MG  capsule    Sig: Place 1 capsule (200 mg total) vaginally at bedtime.    Dispense:  30 capsule    Refill:  5    Initial labs obtained Continue prenatal vitamins Reviewed n/v relief measures and warning s/s to report Reviewed recommended weight gain based on pre-gravid BMI Encouraged well-balanced diet Genetic & carrier screening discussed: requests Panorama, NT/IT, and Horizon  Ultrasound discussed; fetal survey: requested [place] completed> form faxed if has or is planning to apply for medicaid The nature of [employer] for [employer] with multiple MDs and other Advanced Practice Providers was explained to patient; also emphasized that fellows, residents, and students are part of our team. Does have home bp cuff. Office bp cuff given: no. Rx sent: n/a. Check bp weekly, let us know if consistently >140/90.   Follow-up: Return in about 3 weeks (around 01/24/2023) for LROB, 2nd IT, CNM, in person; then 6wks from now for anatomy u/s and LROB w CNM.   Orders Placed This Encounter  Procedures   Urine Culture   GC/Chlamydia Probe Amp   Protein / creatinine ratio, urine   Comprehensive metabolic panel   CBC/D/Plt+RPR+Rh+ABO+RubIgG...   [facility] PRENATAL TEST FULL PANEL   HORIZON CUSTOM   Integrated 1    Roma Schanz CNM, [facility] 01/03/2023 4:28 PM

## 2023-01-03 NOTE — Progress Notes (Signed)
[place] 13+2 wks,measurements c/w dates,NB present,NT 1.8 mm,normal ovaries,anterior placenta,FHR 158 bpm,CRL 69.66 mm

## 2023-01-03 NOTE — Patient Instructions (Signed)
Tara [name], thank you for choosing our office today! We appreciate the opportunity to meet your healthcare needs. You may receive a short survey by mail, e-mail, or through [employer]. If you are happy with your care we would appreciate if you could take just a few minutes to complete the survey questions. We read all of your comments and take your feedback very seriously. Thank you again for choosing our office.  [facility] at [place] [facility] ([place], [place] [address]) Entrance C, located off of [address]   Nausea & Vomiting Have saltine crackers or pretzels by your bed and eat a few bites before you raise your head out of bed in the morning Eat small frequent meals throughout the day instead of large meals Drink plenty of fluids throughout the day to stay hydrated, just don't drink a lot of fluids with your meals.  This can make your stomach fill up faster making you feel sick Do not brush your teeth right after you eat Products with real ginger are good for nausea, like ginger ale and ginger hard candy Make sure it says made with real ginger! Sucking on sour candy like lemon heads is also good for nausea If your prenatal vitamins make you nauseated, take them at night so you will sleep through the nausea Sea Bands If you feel like you need medicine for the nausea & vomiting please let us know If you are unable to keep any fluids or food down please let us know   Constipation Drink plenty of fluid, preferably water, throughout the day Eat foods high in fiber such as fruits, vegetables, and grains Exercise, such as walking, is a good way to keep your bowels regular Drink warm fluids, especially warm prune juice, or decaf coffee Eat a 1/2 cup of real oatmeal (not instant), 1/2 cup applesauce, and 1/2-1 cup warm prune juice every day If needed, you may take Colace (docusate sodium) stool softener  once or twice a day to help keep the stool soft.  If you still are having problems with constipation, you may take Miralax once daily as needed to help keep your bowels regular.   Home Blood Pressure Monitoring for Patients   Your provider has recommended that you check your blood pressure (BP) at least once a week at home. If you do not have a blood pressure cuff at home, one will be provided for you. Contact your provider if you have not received your monitor within 1 week.   Helpful Tips for Accurate Home Blood Pressure Checks  Don't smoke, exercise, or drink caffeine 30 minutes before checking your BP Use the restroom before checking your BP (a full bladder can raise your pressure) Relax in a comfortable upright chair Feet on the ground Left arm resting comfortably on a flat surface at the level of your heart Legs uncrossed Back supported Sit quietly and don't talk Place the cuff on your bare arm Adjust snuggly, so that only two fingertips can fit between your skin and the top of the cuff Check 2 readings separated by at least one minute Keep a log of your BP readings For a visual, please reference this diagram: http://ccnc.care/bpdiagram  Provider Name: [facility]     Phone: [phone]  Zone 1: ALL CLEAR  Continue to monitor your symptoms:  BP reading is less than 140 (top number) or less than 90 (bottom  number)  No right upper stomach pain No headaches or seeing spots No feeling nauseated or throwing up No swelling in face and hands  Zone 2: CAUTION Call your doctor's office for any of the following:  BP reading is greater than 140 (top number) or greater than 90 (bottom number)  Stomach pain under your ribs in the middle or right side Headaches or seeing spots Feeling nauseated or throwing up Swelling in face and hands  Zone 3: EMERGENCY  Seek immediate medical care if you have any of the following:  BP reading is greater than160 (top number) or greater than  110 (bottom number) Severe headaches not improving with Tylenol Serious difficulty catching your breath Any worsening symptoms from Zone 2    First Trimester of Pregnancy The first trimester of pregnancy is from week 1 until the end of week 12 (months 1 through 3). A week after a sperm fertilizes an egg, the egg will implant on the wall of the uterus. This embryo will begin to develop into a baby. Genes from you and your partner are forming the baby. The female genes determine whether the baby is a boy or a girl. At 6-8 weeks, the eyes and face are formed, and the heartbeat can be seen on ultrasound. At the end of 12 weeks, all the baby's organs are formed.  Now that you are pregnant, you will want to do everything you can to have a healthy baby. Two of the most important things are to get good prenatal care and to follow your health care provider's instructions. Prenatal care is all the medical care you receive before the baby's birth. This care will help prevent, find, and treat any problems during the pregnancy and childbirth. BODY CHANGES Your body goes through many changes during pregnancy. The changes vary from woman to woman.  You may gain or lose a couple of pounds at first. You may feel sick to your stomach (nauseous) and throw up (vomit). If the vomiting is uncontrollable, call your health care provider. You may tire easily. You may develop headaches that can be relieved by medicines approved by your health care provider. You may urinate more often. Painful urination may mean you have a bladder infection. You may develop heartburn as a result of your pregnancy. You may develop constipation because certain hormones are causing the muscles that push waste through your intestines to slow down. You may develop hemorrhoids or swollen, bulging veins (varicose veins). Your breasts may begin to grow larger and become tender. Your nipples may stick out more, and the tissue that surrounds them  (areola) may become darker. Your gums may bleed and may be sensitive to brushing and flossing. Dark spots or blotches (chloasma, mask of pregnancy) may develop on your face. This will likely fade after the baby is born. Your menstrual periods will stop. You may have a loss of appetite. You may develop cravings for certain kinds of food. You may have changes in your emotions from day to day, such as being excited to be pregnant or being concerned that something may go wrong with the pregnancy and baby. You may have more vivid and strange dreams. You may have changes in your hair. These can include thickening of your hair, rapid growth, and changes in texture. Some women also have hair loss during or after pregnancy, or hair that feels dry or thin. Your hair will most likely return to normal after your baby is born. WHAT TO EXPECT AT YOUR PRENATAL  VISITS During a routine prenatal visit: You will be weighed to make sure you and the baby are growing normally. Your blood pressure will be taken. Your abdomen will be measured to track your baby's growth. The fetal heartbeat will be listened to starting around week 10 or 12 of your pregnancy. Test results from any previous visits will be discussed. Your health care provider may ask you: How you are feeling. If you are feeling the baby move. If you have had any abnormal symptoms, such as leaking fluid, bleeding, severe headaches, or abdominal cramping. If you have any questions. Other tests that may be performed during your first trimester include: Blood tests to find your blood type and to check for the presence of any previous infections. They will also be used to check for low iron levels (anemia) and Rh antibodies. Later in the pregnancy, blood tests for diabetes will be done along with other tests if problems develop. Urine tests to check for infections, diabetes, or protein in the urine. An ultrasound to confirm the proper growth and development  of the baby. An amniocentesis to check for possible genetic problems. Fetal screens for spina bifida and Down syndrome. You may need other tests to make sure you and the baby are doing well. HOME CARE INSTRUCTIONS  Medicines Follow your health care provider's instructions regarding medicine use. Specific medicines may be either safe or unsafe to take during pregnancy. Take your prenatal vitamins as directed. If you develop constipation, try taking a stool softener if your health care provider approves. Diet Eat regular, well-balanced meals. Choose a variety of foods, such as meat or vegetable-based protein, fish, milk and low-fat dairy products, vegetables, fruits, and whole grain breads and cereals. Your health care provider will help you determine the amount of weight gain that is right for you. Avoid raw meat and uncooked cheese. These carry germs that can cause birth defects in the baby. Eating four or five small meals rather than three large meals a day may help relieve nausea and vomiting. If you start to feel nauseous, eating a few soda crackers can be helpful. Drinking liquids between meals instead of during meals also seems to help nausea and vomiting. If you develop constipation, eat more high-fiber foods, such as fresh vegetables or fruit and whole grains. Drink enough fluids to keep your urine clear or pale yellow. Activity and Exercise Exercise only as directed by your health care provider. Exercising will help you: Control your weight. Stay in shape. Be prepared for labor and delivery. Experiencing pain or cramping in the lower abdomen or low back is a good sign that you should stop exercising. Check with your health care provider before continuing normal exercises. Try to avoid standing for long periods of time. Move your legs often if you must stand in one place for a long time. Avoid heavy lifting. Wear low-heeled shoes, and practice good posture. You may continue to have sex  unless your health care provider directs you otherwise. Relief of Pain or Discomfort Wear a good support bra for breast tenderness.   Take warm sitz baths to soothe any pain or discomfort caused by hemorrhoids. Use hemorrhoid cream if your health care provider approves.   Rest with your legs elevated if you have leg cramps or low back pain. If you develop varicose veins in your legs, wear support hose. Elevate your feet for 15 minutes, 3-4 times a day. Limit salt in your diet. Prenatal Care Schedule your prenatal visits by the  twelfth week of pregnancy. They are usually scheduled monthly at first, then more often in the last 2 months before delivery. Write down your questions. Take them to your prenatal visits. Keep all your prenatal visits as directed by your health care provider. Safety Wear your seat belt at all times when driving. Make a list of emergency phone numbers, including numbers for family, friends, the hospital, and police and fire departments. General Tips Ask your health care provider for a referral to a local prenatal education class. Begin classes no later than at the beginning of month 6 of your pregnancy. Ask for help if you have counseling or nutritional needs during pregnancy. Your health care provider can offer advice or refer you to specialists for help with various needs. Do not use hot tubs, steam rooms, or saunas. Do not douche or use tampons or scented sanitary pads. Do not cross your legs for long periods of time. Avoid cat litter boxes and soil used by cats. These carry germs that can cause birth defects in the baby and possibly loss of the fetus by miscarriage or stillbirth. Avoid all smoking, herbs, alcohol, and medicines not prescribed by your health care provider. Chemicals in these affect the formation and growth of the baby. Schedule a dentist appointment. At home, brush your teeth with a soft toothbrush and be gentle when you floss. SEEK MEDICAL CARE IF:   You have dizziness. You have mild pelvic cramps, pelvic pressure, or nagging pain in the abdominal area. You have persistent nausea, vomiting, or diarrhea. You have a bad smelling vaginal discharge. You have pain with urination. You notice increased swelling in your face, hands, legs, or ankles. SEEK IMMEDIATE MEDICAL CARE IF:  You have a fever. You are leaking fluid from your vagina. You have spotting or bleeding from your vagina. You have severe abdominal cramping or pain. You have rapid weight gain or loss. You vomit blood or material that looks like coffee grounds. You are exposed to [place] measles and have never had them. You are exposed to fifth disease or chickenpox. You develop a severe headache. You have shortness of breath. You have any kind of trauma, such as from a fall or a car accident. Document Released: 11/16/2001 Document Revised: 04/08/2014 Document Reviewed: 10/02/2013 [facility] Patient Information 2015 [place], [place]. This information is not intended to replace advice given to you by your health care provider. Make sure you discuss any questions you have with your health care provider.

## 2023-01-04 LAB — INTEGRATED 1

## 2023-01-05 LAB — COMPREHENSIVE METABOLIC PANEL
ALT: 8 IU/L (ref 0–32)
AST: 13 IU/L (ref 0–40)
Albumin/Globulin Ratio: 2 (ref 1.2–2.2)
Albumin: 4.5 g/dL (ref 4.0–5.0)
Alkaline Phosphatase: 56 IU/L (ref 44–121)
BUN/Creatinine Ratio: 10 (ref 9–23)
BUN: 4 mg/dL — ABNORMAL LOW (ref 6–20)
Bilirubin Total: 0.2 mg/dL (ref 0.0–1.2)
CO2: 22 mmol/L (ref 20–29)
Calcium: 9 mg/dL (ref 8.7–10.2)
Chloride: 100 mmol/L (ref 96–106)
Creatinine, Ser: 0.42 mg/dL — ABNORMAL LOW (ref 0.57–1.00)
Globulin, Total: 2.3 g/dL (ref 1.5–4.5)
Glucose: 82 mg/dL (ref 70–99)
Potassium: 3.8 mmol/L (ref 3.5–5.2)
Sodium: 135 mmol/L (ref 134–144)
Total Protein: 6.8 g/dL (ref 6.0–8.5)
eGFR: 138 mL/min/{1.73_m2} (ref >59)

## 2023-01-05 LAB — CBC/D/PLT+RPR+RH+ABO+RUBIGG...
Antibody Screen: NEGATIVE
Basophils Absolute: 0 10*3/uL (ref 0.0–0.2)
Basos: 0 %
EOS (ABSOLUTE): 0 10*3/uL (ref 0.0–0.4)
Eos: 0 %
HCV Ab: NONREACTIVE
HIV Screen 4th Generation wRfx: NONREACTIVE
Hematocrit: 35.5 % (ref 34.0–46.6)
Hemoglobin: 12.2 g/dL (ref 11.1–15.9)
Hepatitis B Surface Ag: NEGATIVE
Immature Grans (Abs): 0 10*3/uL (ref 0.0–0.1)
Immature Granulocytes: 0 %
Lymphocytes Absolute: 2.8 10*3/uL (ref 0.7–3.1)
Lymphs: 28 %
MCH: 29.3 pg (ref 26.6–33.0)
MCHC: 34.4 g/dL (ref 31.5–35.7)
MCV: 85 fL (ref 79–97)
Monocytes Absolute: 0.5 10*3/uL (ref 0.1–0.9)
Monocytes: 5 %
Neutrophils Absolute: 6.7 10*3/uL (ref 1.4–7.0)
Neutrophils: 67 %
Platelets: 162 10*3/uL (ref 150–450)
RBC: 4.17 x10E6/uL (ref 3.77–5.28)
RDW: 12.8 % (ref 11.7–15.4)
RPR Ser Ql: NONREACTIVE
Rh Factor: POSITIVE
Rubella Antibodies, IGG: 1.49 index (ref >0.99)
WBC: 10.2 10*3/uL (ref 3.4–10.8)

## 2023-01-05 LAB — INTEGRATED 1
Crown Rump Length: 69.7 mm
Gest. Age on Collection Date: 13 weeks
Maternal Age at EDD: 27.2 yr
Nuchal Translucency (NT): 1.8 mm
Number of Fetuses: 1
PAPP-A Value: 690.4 ng/mL
Weight: 114 [lb_av]

## 2023-01-05 LAB — PROTEIN / CREATININE RATIO, URINE
Creatinine, Urine: 131.1 mg/dL
Protein, Ur: 10.6 mg/dL
Protein/Creat Ratio: 81 mg/g creat (ref 0–200)

## 2023-01-05 LAB — HCV INTERPRETATION

## 2023-01-05 LAB — URINE CULTURE: Organism ID, Bacteria: NO GROWTH

## 2023-01-05 LAB — GC/CHLAMYDIA PROBE AMP
Chlamydia trachomatis, NAA: NEGATIVE
Neisseria Gonorrhoeae by PCR: NEGATIVE

## 2023-01-10 LAB — PANORAMA PRENATAL TEST FULL PANEL:PANORAMA TEST PLUS 5 ADDITIONAL MICRODELETIONS: FETAL FRACTION: 11.6

## 2023-01-18 LAB — HORIZON CUSTOM: REPORT SUMMARY: NEGATIVE

## 2023-01-24 ENCOUNTER — Encounter: Payer: Self-pay | Admitting: Advanced Practice Midwife

## 2023-01-24 ENCOUNTER — Ambulatory Visit (INDEPENDENT_AMBULATORY_CARE_PROVIDER_SITE_OTHER): Payer: Medicaid Other | Admitting: Advanced Practice Midwife

## 2023-01-24 VITALS — BP 105/68 | HR 107 | Wt 119.0 lb

## 2023-01-24 DIAGNOSIS — Z3480 Encounter for supervision of other normal pregnancy, unspecified trimester: Secondary | ICD-10-CM

## 2023-01-24 DIAGNOSIS — Z3482 Encounter for supervision of other normal pregnancy, second trimester: Secondary | ICD-10-CM

## 2023-01-24 DIAGNOSIS — Z3A16 16 weeks gestation of pregnancy: Secondary | ICD-10-CM

## 2023-01-24 NOTE — Progress Notes (Signed)
   LOW-RISK PREGNANCY VISIT Patient name: Tara [name] MRN [id number]  Date of birth: [birth year] Chief Complaint:   Routine Prenatal Visit  History of Present Illness:   Tara [name] is a 27 y.o. G71P0101 female at 9w2dwith an Estimated Date of Delivery: 07/09/23 being seen today for ongoing management of a low-risk pregnancy.  Today she reports no complaints. Contractions: Not present. Vag. Bleeding: None.  Movement: Present. denies leaking of fluid. Review of Systems:   Pertinent items are noted in HPI Denies abnormal vaginal discharge w/ itching/odor/irritation, headaches, visual changes, shortness of breath, chest pain, abdominal pain, severe nausea/vomiting, or problems with urination or bowel movements unless otherwise stated above. Pertinent History Reviewed:  Reviewed past medical,surgical, social, obstetrical and family history.  Reviewed problem list, medications and allergies. Physical Assessment:   Vitals:   01/24/23 1357  BP: 105/68  Pulse: (!) 107  Weight: 119 lb (54 kg)  Body mass index is 24.66 kg/m.        Physical Examination:   General appearance: Well appearing, and in no distress  Mental status: Alert, oriented to person, place, and time  Skin: Warm & dry  Cardiovascular: Normal heart rate noted  Respiratory: Normal respiratory effort, no distress  Abdomen: Soft, gravid, nontender  Pelvic: Cervical exam deferred         Extremities: Edema: None  Fetal Status: Fetal Heart Rate (bpm): 148   Movement: Present    Chaperone:  N/A    No results found for this or any previous visit (from the past 24 hour(s)).  Assessment & Plan:    Pregnancy: G2P0101 at 127w2d. Supervision of other normal pregnancy, antepartum  - INTEGRATED 2  2. [redacted] weeks gestation of pregnancy      Meds: No orders of the defined types were placed in this encounter.  Labs/procedures today: 2nd IT  Plan:  Continue routine obstetrical care  Next visit: prefers will be in  person for anatomy scan     Reviewed: general obstetric precautions including but not limited to vaginal bleeding, contractions, leaking of fluid and fetal movement were reviewed in detail with the patient.  All questions were answered. Has home bp cuff.. Check bp weekly, let usKoreanow if >140/90.   Follow-up: Return for As scheduled.  Future Appointments  Date Time Provider [place]3/13/2024  3:00 PM [facility] [id number] None  02/16/2023  3:50 PM ShMyrtis SerCNM [facility]TO[facility]    Orders Placed This Encounter  Procedures   INTEGRATED 2   FrChristin FudgeNP, CNM 01/24/2023 2:17 PM

## 2023-01-24 NOTE — Patient Instructions (Signed)
Gregery Na, I greatly value your feedback.  If you receive a survey following your visit with [place] today, we appreciate you taking the time to fill it out.  Thanks, Nigel Berthold, CNM     [place]!!! It is now [place] at [facility] ([place], [place] [address]) Entrance located off of [address] parking   Go to [email or web address] to register for FREE online childbirth classes    Second Trimester of Pregnancy The second trimester is from week 14 through week 27 (months 4 through 6). The second trimester is often a time when you feel your best. Your body has adjusted to being pregnant, and you begin to feel better physically. Usually, morning sickness has lessened or quit completely, you may have more energy, and you may have an increase in appetite. The second trimester is also a time when the fetus is growing rapidly. At the end of the sixth month, the fetus is about 9 inches long and weighs about 1 pounds. You will likely begin to feel the baby move (quickening) between 16 and 20 weeks of pregnancy. Body changes during your second trimester Your body continues to go through many changes during your second trimester. The changes vary from woman to woman. Your weight will continue to increase. You will notice your lower abdomen bulging out. You may begin to get stretch marks on your hips, abdomen, and breasts. You may develop headaches that can be relieved by medicines. The medicines should be approved by your health care provider. You may urinate more often because the fetus is pressing on your bladder. You may develop or continue to have heartburn as a result of your pregnancy. You may develop constipation because certain hormones are causing the muscles that push waste through your intestines to slow down. You may develop hemorrhoids or swollen, bulging veins (varicose veins). You may have back pain. This is  caused by: Weight gain. Pregnancy hormones that are relaxing the joints in your pelvis. A shift in weight and the muscles that support your balance. Your breasts will continue to grow and they will continue to become tender. Your gums may bleed and may be sensitive to brushing and flossing. Dark spots or blotches (chloasma, mask of pregnancy) may develop on your face. This will likely fade after the baby is born. A dark line from your belly button to the pubic area (linea nigra) may appear. This will likely fade after the baby is born. You may have changes in your hair. These can include thickening of your hair, rapid growth, and changes in texture. Some women also have hair loss during or after pregnancy, or hair that feels dry or thin. Your hair will most likely return to normal after your baby is born.  What to expect at prenatal visits During a routine prenatal visit: You will be weighed to make sure you and the fetus are growing normally. Your blood pressure will be taken. Your abdomen will be measured to track your baby's growth. The fetal heartbeat will be listened to. Any test results from the previous visit will be discussed.  Your health care provider may ask you: How you are feeling. If you are feeling the baby move. If you have had any abnormal symptoms, such as leaking fluid, bleeding, severe headaches, or abdominal cramping. If you are using any tobacco products, including cigarettes, chewing tobacco, and electronic cigarettes. If you have any questions.  Other tests that  may be performed during your second trimester include: Blood tests that check for: Low iron levels (anemia). High blood sugar that affects pregnant women (gestational diabetes) between 38 and 28 weeks. Rh antibodies. This is to check for a protein on red blood cells (Rh factor). Urine tests to check for infections, diabetes, or protein in the urine. An ultrasound to confirm the proper growth and  development of the baby. An amniocentesis to check for possible genetic problems. Fetal screens for spina bifida and Down syndrome. HIV (human immunodeficiency virus) testing. Routine prenatal testing includes screening for HIV, unless you choose not to have this test.  Follow these instructions at home: Medicines Follow your health care provider's instructions regarding medicine use. Specific medicines may be either safe or unsafe to take during pregnancy. Take a prenatal vitamin that contains at least 600 micrograms (mcg) of folic acid. If you develop constipation, try taking a stool softener if your health care provider approves. Eating and drinking Eat a balanced diet that includes fresh fruits and vegetables, whole grains, good sources of protein such as meat, eggs, or tofu, and low-fat dairy. Your health care provider will help you determine the amount of weight gain that is right for you. Avoid raw meat and uncooked cheese. These carry germs that can cause birth defects in the baby. If you have low calcium intake from food, talk to your health care provider about whether you should take a daily calcium supplement. Limit foods that are high in fat and processed sugars, such as fried and sweet foods. To prevent constipation: Drink enough fluid to keep your urine clear or pale yellow. Eat foods that are high in fiber, such as fresh fruits and vegetables, whole grains, and beans. Activity Exercise only as directed by your health care provider. Most women can continue their usual exercise routine during pregnancy. Try to exercise for 30 minutes at least 5 days a week. Stop exercising if you experience uterine contractions. Avoid heavy lifting, wear low heel shoes, and practice good posture. A sexual relationship may be continued unless your health care provider directs you otherwise. Relieving pain and discomfort Wear a good support bra to prevent discomfort from breast tenderness. Take  warm sitz baths to soothe any pain or discomfort caused by hemorrhoids. Use hemorrhoid cream if your health care provider approves. Rest with your legs elevated if you have leg cramps or low back pain. If you develop varicose veins, wear support hose. Elevate your feet for 15 minutes, 3-4 times a day. Limit salt in your diet. Prenatal Care Write down your questions. Take them to your prenatal visits. Keep all your prenatal visits as told by your health care provider. This is important. Safety Wear your seat belt at all times when driving. Make a list of emergency phone numbers, including numbers for family, friends, the hospital, and police and fire departments. General instructions Ask your health care provider for a referral to a local prenatal education class. Begin classes no later than the beginning of month 6 of your pregnancy. Ask for help if you have counseling or nutritional needs during pregnancy. Your health care provider can offer advice or refer you to specialists for help with various needs. Do not use hot tubs, steam rooms, or saunas. Do not douche or use tampons or scented sanitary pads. Do not cross your legs for long periods of time. Avoid cat litter boxes and soil used by cats. These carry germs that can cause birth defects in the baby and  possibly loss of the fetus by miscarriage or stillbirth. Avoid all smoking, herbs, alcohol, and unprescribed drugs. Chemicals in these products can affect the formation and growth of the baby. Do not use any products that contain nicotine or tobacco, such as cigarettes and e-cigarettes. If you need help quitting, ask your health care provider. Visit your dentist if you have not gone yet during your pregnancy. Use a soft toothbrush to brush your teeth and be gentle when you floss. Contact a health care provider if: You have dizziness. You have mild pelvic cramps, pelvic pressure, or nagging pain in the abdominal area. You have persistent  nausea, vomiting, or diarrhea. You have a bad smelling vaginal discharge. You have pain when you urinate. Get help right away if: You have a fever. You are leaking fluid from your vagina. You have spotting or bleeding from your vagina. You have severe abdominal cramping or pain. You have rapid weight gain or weight loss. You have shortness of breath with chest pain. You notice sudden or extreme swelling of your face, hands, ankles, feet, or legs. You have not felt your baby move in over an hour. You have severe headaches that do not go away when you take medicine. You have vision changes. Summary The second trimester is from week 14 through week 27 (months 4 through 6). It is also a time when the fetus is growing rapidly. Your body goes through many changes during pregnancy. The changes vary from woman to woman. Avoid all smoking, herbs, alcohol, and unprescribed drugs. These chemicals affect the formation and growth your baby. Do not use any tobacco products, such as cigarettes, chewing tobacco, and e-cigarettes. If you need help quitting, ask your health care provider. Contact your health care provider if you have any questions. Keep all prenatal visits as told by your health care provider. This is important. This information is not intended to replace advice given to you by your health care provider. Make sure you discuss any questions you have with your health care provider.

## 2023-02-02 LAB — INTEGRATED 2
AFP MoM: 1.27
Alpha-Fetoprotein: 47.5 ng/mL
Crown Rump Length: 69.7 mm
DIA MoM: 1.32
DIA Value: 243.9 pg/mL
Estriol, Unconjugated: 1.5 ng/mL
Gest. Age on Collection Date: 13 weeks
Gestational Age: 16 weeks
Maternal Age at EDD: 27.2 yr
Nuchal Translucency (NT): 1.8 mm
Nuchal Translucency MoM: 1.14
Number of Fetuses: 1
PAPP-A MoM: 0.41
PAPP-A Value: 690.4 ng/mL
Test Results:: NEGATIVE
Weight: 114 [lb_av]
Weight: 114 [lb_av]
hCG MoM: 0.6
hCG Value: 26.2 IU/mL
uE3 MoM: 1.51

## 2023-02-15 ENCOUNTER — Other Ambulatory Visit: Payer: Self-pay | Admitting: Advanced Practice Midwife

## 2023-02-15 DIAGNOSIS — Z363 Encounter for antenatal screening for malformations: Secondary | ICD-10-CM

## 2023-02-16 ENCOUNTER — Ambulatory Visit (INDEPENDENT_AMBULATORY_CARE_PROVIDER_SITE_OTHER): Payer: Medicaid Other

## 2023-02-16 ENCOUNTER — Ambulatory Visit (INDEPENDENT_AMBULATORY_CARE_PROVIDER_SITE_OTHER): Payer: Medicaid Other | Admitting: Advanced Practice Midwife

## 2023-02-16 VITALS — BP 105/63 | HR 88 | Wt 126.0 lb

## 2023-02-16 DIAGNOSIS — Z3A19 19 weeks gestation of pregnancy: Secondary | ICD-10-CM | POA: Diagnosis not present

## 2023-02-16 DIAGNOSIS — Z3480 Encounter for supervision of other normal pregnancy, unspecified trimester: Secondary | ICD-10-CM

## 2023-02-16 DIAGNOSIS — Z363 Encounter for antenatal screening for malformations: Secondary | ICD-10-CM

## 2023-02-16 DIAGNOSIS — Z3482 Encounter for supervision of other normal pregnancy, second trimester: Secondary | ICD-10-CM

## 2023-02-16 NOTE — Progress Notes (Signed)
[place] 0000000 wks,cephalic,anterior placenta gr 0,echogenic bowel,cx 3.5 cm,FHR 152 bpm,SVP of fluid 3.4 cm,EFW 305 g 49%,anatomy complete

## 2023-02-16 NOTE — Progress Notes (Signed)
   LOW-RISK PREGNANCY VISIT Patient name: Tara [name] MRN [id number]  Date of birth: 05/13/1996 Chief Complaint:   Routine Prenatal Visit  History of Present Illness:   Tara [name] is a 27 y.o. G1P0101 female at [redacted]w[redacted]d with an Estimated Date of Delivery: 07/09/23 being seen today for ongoing management of a low-risk pregnancy.  Today she reports  some hip discomfort, esp L side . Contractions: Not present. Vag. Bleeding: None.  Movement: Present. denies leaking of fluid. Review of Systems:   Pertinent items are noted in HPI Denies abnormal vaginal discharge w/ itching/odor/irritation, headaches, visual changes, shortness of breath, chest pain, abdominal pain, severe nausea/vomiting, or problems with urination or bowel movements unless otherwise stated above. Pertinent History Reviewed:  Reviewed past medical,surgical, social, obstetrical and family history.  Reviewed problem list, medications and allergies. Physical Assessment:   Vitals:   02/16/23 1547  BP: 105/63  Pulse: 88  Weight: 126 lb (57.2 kg)  Body mass index is 26.11 kg/m.        Physical Examination:   General appearance: Well appearing, and in no distress  Mental status: Alert, oriented to person, place, and time  Skin: Warm & dry  Cardiovascular: Normal heart rate noted  Respiratory: Normal respiratory effort, no distress  Abdomen: Soft, gravid, nontender  Pelvic: Cervical exam deferred         Extremities: Edema: None  Fetal Status: Fetal Heart Rate (bpm): 152 u/s   Movement: Present    Anatomy u/s: [place] 65+0 wks,cephalic,anterior placenta gr 0,echogenic bowel,cx 3.5 cm,FHR 152 bpm,SVP of fluid 3.4 cm,EFW 305 g 49%,anatomy complete   No results found for this or any previous visit (from the past 24 hour(s)).  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  Labs/procedures today: anatomy u/s  Plan:  Continue routine obstetrical care   Reviewed: Preterm labor symptoms and general obstetric precautions including but not limited to vaginal bleeding, contractions, leaking of fluid and fetal movement were reviewed in detail with the patient.  All questions were answered. Has home bp cuff. Check bp weekly, let us know if >140/90.   Follow-up: Return in about 4 weeks (around 03/16/2023) for [place], in person.  No orders of the defined types were placed in this encounter.  Myrtis Ser CNM 02/16/2023 4:03 PM

## 2023-02-16 NOTE — Patient Instructions (Signed)
Tara [name], thank you for choosing our office today! We appreciate the opportunity to meet your healthcare needs. You may receive a short survey by mail, e-mail, or through [employer]. If you are happy with your care we would appreciate if you could take just a few minutes to complete the survey questions. We read all of your comments and take your feedback very seriously. Thank you again for choosing our office.  Center for [employer] Team at [place] [facility] ([place], [place] [address]) Entrance C, located off of [address] parking  Go to [email or web address] to register for FREE online childbirth classes  Call the office [phone]) or go to [facility] if: You begin to severe cramping Your water breaks.  Sometimes it is a big gush of fluid, sometimes it is just a trickle that keeps getting your panties wet or running down your legs You have vaginal bleeding.  It is normal to have a small amount of spotting if your cervix was checked.   [facility] Pediatricians/Family Doctors [facility]): [address] [place], [facility]: [address] [place], [phone]                [facility]): [place], [phone] (call to ask if accepting patients) [facility] Department: [address], [place], [place] Pediatricians/Family Doctors [facility]): [place] [place], [address], [facility] [facility]: [address] [place], [place] [facility]: [place], [place] [facility]): [phone] [facility]: [address], [place]: [address] [facility]: [phone], [phone]  Home Blood Pressure Monitoring for Patients   Your provider has recommended that you check your blood pressure (BP) at least once a week at home. If you do not have a blood pressure cuff at home, one will be provided for you. Contact your provider if you have not received your monitor within 1 week.   Helpful Tips for Accurate Home Blood Pressure Checks  Don't smoke, exercise, or drink caffeine 30 minutes before checking your BP Use the restroom before checking your BP (a full bladder can raise your pressure) Relax in a comfortable upright chair Feet on the ground Left arm resting comfortably on a flat surface at the level of your heart Legs uncrossed Back supported Sit quietly and don't talk Place the cuff on your bare arm Adjust snuggly, so that only two fingertips can fit between your skin and the top of the cuff Check 2 readings separated by at least one minute Keep a log of your BP readings For a visual, please reference this diagram: http://ccnc.care/bpdiagram  Provider Name: [facility]     Phone: [phone]  Zone 1: ALL CLEAR  Continue to monitor your symptoms:  BP reading is less than 140 (top number) or less than 90 (bottom number)  No right upper stomach pain No headaches or seeing spots No feeling nauseated or throwing up No swelling in face and hands  Zone 2: CAUTION Call your doctor's office for any of the following:  BP reading is greater than 140 (top number) or greater than  90 (bottom number)  Stomach pain under your ribs in the middle or right side Headaches or seeing spots Feeling nauseated or throwing up Swelling in face and hands  Zone 3: EMERGENCY  Seek immediate medical care if you have any of the following:  BP reading is greater than160 (top number) or greater than 110 (bottom number) Severe headaches not improving with Tylenol Serious difficulty catching your breath Any worsening symptoms from  Zone 2     Second Trimester of Pregnancy The second trimester is from week 14 through week 27 (months 4 through 6). The second trimester is often a time when you feel your best. Your body has adjusted to being pregnant, and you begin to feel better physically. Usually, morning sickness has lessened or quit completely, you may have more energy, and you may have an increase in appetite. The second trimester is also a time when the fetus is growing rapidly. At the end of the sixth month, the fetus is about 9 inches long and weighs about 1 pounds. You will likely begin to feel the baby move (quickening) between 16 and 20 weeks of pregnancy. Body changes during your second trimester Your body continues to go through many changes during your second trimester. The changes vary from woman to woman. Your weight will continue to increase. You will notice your lower abdomen bulging out. You may begin to get stretch marks on your hips, abdomen, and breasts. You may develop headaches that can be relieved by medicines. The medicines should be approved by your health care provider. You may urinate more often because the fetus is pressing on your bladder. You may develop or continue to have heartburn as a result of your pregnancy. You may develop constipation because certain hormones are causing the muscles that push waste through your intestines to slow down. You may develop hemorrhoids or swollen, bulging veins (varicose veins). You may have back pain. This is caused by: Weight gain. Pregnancy hormones that are relaxing the joints in your pelvis. A shift in weight and the muscles that support your balance. Your breasts will continue to grow and they will continue to become tender. Your gums may bleed and may be sensitive to brushing and flossing. Dark spots or blotches (chloasma, mask of pregnancy) may develop on your face. This will likely fade after the baby is born. A dark line from your belly button to  the pubic area (linea nigra) may appear. This will likely fade after the baby is born. You may have changes in your hair. These can include thickening of your hair, rapid growth, and changes in texture. Some women also have hair loss during or after pregnancy, or hair that feels dry or thin. Your hair will most likely return to normal after your baby is born.  What to expect at prenatal visits During a routine prenatal visit: You will be weighed to make sure you and the fetus are growing normally. Your blood pressure will be taken. Your abdomen will be measured to track your baby's growth. The fetal heartbeat will be listened to. Any test results from the previous visit will be discussed.  Your health care provider may ask you: How you are feeling. If you are feeling the baby move. If you have had any abnormal symptoms, such as leaking fluid, bleeding, severe headaches, or abdominal cramping. If you are using any tobacco products, including cigarettes, chewing tobacco, and electronic cigarettes. If you have any questions.  Other tests that may be performed during  your second trimester include: Blood tests that check for: Low iron levels (anemia). High blood sugar that affects pregnant women (gestational diabetes) between 63 and 28 weeks. Rh antibodies. This is to check for a protein on red blood cells (Rh factor). Urine tests to check for infections, diabetes, or protein in the urine. An ultrasound to confirm the proper growth and development of the baby. An amniocentesis to check for possible genetic problems. Fetal screens for spina bifida and Down syndrome. HIV (human immunodeficiency virus) testing. Routine prenatal testing includes screening for HIV, unless you choose not to have this test.  Follow these instructions at home: Medicines Follow your health care provider's instructions regarding medicine use. Specific medicines may be either safe or unsafe to take during  pregnancy. Take a prenatal vitamin that contains at least 600 micrograms (mcg) of folic acid. If you develop constipation, try taking a stool softener if your health care provider approves. Eating and drinking Eat a balanced diet that includes fresh fruits and vegetables, whole grains, good sources of protein such as meat, eggs, or tofu, and low-fat dairy. Your health care provider will help you determine the amount of weight gain that is right for you. Avoid raw meat and uncooked cheese. These carry germs that can cause birth defects in the baby. If you have low calcium intake from food, talk to your health care provider about whether you should take a daily calcium supplement. Limit foods that are high in fat and processed sugars, such as fried and sweet foods. To prevent constipation: Drink enough fluid to keep your urine clear or pale yellow. Eat foods that are high in fiber, such as fresh fruits and vegetables, whole grains, and beans. Activity Exercise only as directed by your health care provider. Most women can continue their usual exercise routine during pregnancy. Try to exercise for 30 minutes at least 5 days a week. Stop exercising if you experience uterine contractions. Avoid heavy lifting, wear low heel shoes, and practice good posture. A sexual relationship may be continued unless your health care provider directs you otherwise. Relieving pain and discomfort Wear a good support bra to prevent discomfort from breast tenderness. Take warm sitz baths to soothe any pain or discomfort caused by hemorrhoids. Use hemorrhoid cream if your health care provider approves. Rest with your legs elevated if you have leg cramps or low back pain. If you develop varicose veins, wear support hose. Elevate your feet for 15 minutes, 3-4 times a day. Limit salt in your diet. Prenatal Care Write down your questions. Take them to your prenatal visits. Keep all your prenatal visits as told by your health  care provider. This is important. Safety Wear your seat belt at all times when driving. Make a list of emergency phone numbers, including numbers for family, friends, the hospital, and police and fire departments. General instructions Ask your health care provider for a referral to a local prenatal education class. Begin classes no later than the beginning of month 6 of your pregnancy. Ask for help if you have counseling or nutritional needs during pregnancy. Your health care provider can offer advice or refer you to specialists for help with various needs. Do not use hot tubs, steam rooms, or saunas. Do not douche or use tampons or scented sanitary pads. Do not cross your legs for long periods of time. Avoid cat litter boxes and soil used by cats. These carry germs that can cause birth defects in the baby and possibly loss of the  fetus by miscarriage or stillbirth. Avoid all smoking, herbs, alcohol, and unprescribed drugs. Chemicals in these products can affect the formation and growth of the baby. Do not use any products that contain nicotine or tobacco, such as cigarettes and e-cigarettes. If you need help quitting, ask your health care provider. Visit your dentist if you have not gone yet during your pregnancy. Use a soft toothbrush to brush your teeth and be gentle when you floss. Contact a health care provider if: You have dizziness. You have mild pelvic cramps, pelvic pressure, or nagging pain in the abdominal area. You have persistent nausea, vomiting, or diarrhea. You have a bad smelling vaginal discharge. You have pain when you urinate. Get help right away if: You have a fever. You are leaking fluid from your vagina. You have spotting or bleeding from your vagina. You have severe abdominal cramping or pain. You have rapid weight gain or weight loss. You have shortness of breath with chest pain. You notice sudden or extreme swelling of your face, hands, ankles, feet, or legs. You  have not felt your baby move in over an hour. You have severe headaches that do not go away when you take medicine. You have vision changes. Summary The second trimester is from week 14 through week 27 (months 4 through 6). It is also a time when the fetus is growing rapidly. Your body goes through many changes during pregnancy. The changes vary from woman to woman. Avoid all smoking, herbs, alcohol, and unprescribed drugs. These chemicals affect the formation and growth your baby. Do not use any tobacco products, such as cigarettes, chewing tobacco, and e-cigarettes. If you need help quitting, ask your health care provider. Contact your health care provider if you have any questions. Keep all prenatal visits as told by your health care provider. This is important. This information is not intended to replace advice given to you by your health care provider. Make sure you discuss any questions you have with your health care provider. Document Released: 11/16/2001 Document Revised: 04/29/2016 Document Reviewed: 01/23/2013 Elsevier Interactive Patient Education  2017 [employer].

## 2023-03-16 ENCOUNTER — Ambulatory Visit (INDEPENDENT_AMBULATORY_CARE_PROVIDER_SITE_OTHER): Payer: Medicaid Other | Admitting: Advanced Practice Midwife

## 2023-03-16 ENCOUNTER — Encounter: Payer: Self-pay | Admitting: Advanced Practice Midwife

## 2023-03-16 VITALS — BP 103/57 | HR 91 | Wt 138.0 lb

## 2023-03-16 DIAGNOSIS — Z302 Encounter for sterilization: Secondary | ICD-10-CM | POA: Insufficient documentation

## 2023-03-16 DIAGNOSIS — Z3480 Encounter for supervision of other normal pregnancy, unspecified trimester: Secondary | ICD-10-CM

## 2023-03-16 DIAGNOSIS — Z3482 Encounter for supervision of other normal pregnancy, second trimester: Secondary | ICD-10-CM

## 2023-03-16 DIAGNOSIS — Z3A23 23 weeks gestation of pregnancy: Secondary | ICD-10-CM

## 2023-03-16 NOTE — Patient Instructions (Signed)
Tara Tara [name], I greatly value your feedback.  If you receive a survey following your visit with [place] today, we appreciate you taking the time to fill it out.  Thanks, Tara Tara [name], CNM   You will have your sugar test next visit.  Please do not eat or drink anything after midnight the night before you come, not even water.  You will be here for at least two hours.  Please make an appointment online for the bloodwork at [email or web address] for 8:30am (or as close to this as possible). Make sure you select the [facility] service center. The day of the appointment, check in with our office first, then you will go to [facility] to start the sugar test.    [facility] HAS MOVED!!! It is now [facility] ([address] [place], [place] [address]) Entrance C, located off of [address] [employer] valet parking  Go to [email or web address] to register for FREE online childbirth classes   Call the office [phone]) or go to [facility] if: You begin to have strong, frequent contractions Your water breaks.  Sometimes it is a big gush of fluid, sometimes it is just a trickle that keeps getting your panties wet or running down your legs You have vaginal bleeding.  It is normal to have a small amount of spotting if your cervix was checked.  You don't feel your baby moving Tara [name] normal.  If you don't, get you something to eat and drink and lay down and focus on feeling your baby move.   If your baby is still not moving Tara [name] normal, you should call the office or go to [facility].  [place] Pediatricians/Family Doctors: [facility] [phone]           [facility] [phone]                [facility] [phone] (usually not accepting new patients unless you have family there already, you are always welcome to call and ask)      [facility] Department [phone]       [facility] Pediatricians/Family Doctors:  [facility]: [phone] [facility]: [phone] [facility]: [phone]  [facility] Doctors:  [facility]: [phone]  [facility]: [phone]  [facility] Doctors: [facility]: [phone]   Home Blood Pressure Monitoring for Patients   Your provider has recommended that you check your blood pressure (BP) at least once a week at home. If you do not have a blood pressure cuff at home, one will be provided for you. Contact your provider if you have not received your monitor within 1 week.   Helpful Tips for Accurate Home Blood Pressure Checks  Don't smoke, exercise, or drink caffeine 30 minutes before checking your BP Use the restroom before checking your BP (a full bladder can raise your pressure) Relax in a comfortable upright chair Feet on the ground Left arm resting comfortably on a flat surface at the level of your heart Legs uncrossed Back supported Sit quietly and don't talk Place the cuff on your bare arm Adjust snuggly, so that only two fingertips can fit between your skin and the top of the cuff Check 2 readings separated by at least one minute Keep a log of your BP readings For a visual, please reference this diagram: http://ccnc.care/bpdiagram  Provider Name: [facility]     Phone: [phone]  Zone 1: ALL CLEAR  Continue to monitor your symptoms:  BP reading is less than 140 (top number) or less than 90 (bottom number)  No right upper stomach pain No headaches or seeing spots No feeling nauseated or throwing up No swelling in face and hands  Zone 2: CAUTION Call your doctor's office for any of the following:  BP reading is greater than 140 (top number) or greater than 90 (bottom number)  Stomach pain under your ribs in the middle or right side Headaches or seeing spots Feeling nauseated or throwing up Swelling in face and hands  Zone 3: EMERGENCY  Seek  immediate medical care if you have any of the following:  BP reading is greater than160 (top number) or greater than 110 (bottom number) Severe headaches not improving with Tylenol Serious difficulty catching your breath Any worsening symptoms from Zone 2   Second Trimester of Pregnancy The second trimester is from week 13 through week 28, months 4 through 6. The second trimester is often a time when you feel your best. Your body has also adjusted to being pregnant, and you begin to feel better physically. Usually, morning sickness has lessened or quit completely, you may have more energy, and you may have an increase in appetite. The second trimester is also a time when the fetus is growing rapidly. At the end of the sixth month, the fetus is about 9 inches long and weighs about 1 pounds. You will likely begin to feel the baby move (quickening) between 18 and 20 weeks of the pregnancy. BODY CHANGES Your body goes through many changes during pregnancy. The changes vary from woman to woman.  Your weight will continue to increase. You will notice your lower abdomen bulging out. You may begin to get stretch marks on your hips, abdomen, and breasts. You may develop headaches that can be relieved by medicines approved by your health care provider. You may urinate more often because the fetus is pressing on your bladder. You may develop or continue to have heartburn as a result of your pregnancy. You may develop constipation because certain hormones are causing the muscles that push waste through your intestines to slow down. You may develop hemorrhoids or swollen, bulging veins (varicose veins). You may have back pain because of the weight gain and pregnancy hormones relaxing your joints between the bones in your pelvis and as a result of a shift in weight and the muscles that support your balance. Your breasts will continue to grow and be tender. Your gums may bleed and may be sensitive to brushing  and flossing. Dark spots or blotches (chloasma, mask of pregnancy) may develop on your face. This will likely fade after the baby is born. A dark line from your belly button to the pubic area (linea nigra) may appear. This will likely fade after the baby is born. You may have changes in your hair. These can include thickening of your hair, rapid growth, and changes in texture. Some women also have hair loss during or after pregnancy, or hair that feels dry or thin. Your hair will most likely return to normal after your baby is born. WHAT TO EXPECT AT YOUR PRENATAL VISITS During a routine prenatal visit: You will be weighed to make sure you and the fetus are growing normally. Your blood pressure will be taken. Your abdomen will be measured to track your baby's growth. The fetal heartbeat will be listened to. Any test results from the previous visit will be discussed. Your health care provider  may ask you: How you are feeling. If you are feeling the baby move. If you have had any abnormal symptoms, such as leaking fluid, bleeding, severe headaches, or abdominal cramping. If you have any questions. Other tests that may be performed during your second trimester include: Blood tests that check for: Low iron levels (anemia). Gestational diabetes (between 24 and 28 weeks). Rh antibodies. Urine tests to check for infections, diabetes, or protein in the urine. An ultrasound to confirm the proper growth and development of the baby. An amniocentesis to check for possible genetic problems. Fetal screens for spina bifida and Down syndrome. HOME CARE INSTRUCTIONS  Avoid all smoking, herbs, alcohol, and unprescribed drugs. These chemicals affect the formation and growth of the baby. Follow your health care provider's instructions regarding medicine use. There are medicines that are either safe or unsafe to take during pregnancy. Exercise only as directed by your health care provider. Experiencing  uterine cramps is a good sign to stop exercising. Continue to eat regular, healthy meals. Wear a good support bra for breast tenderness. Do not use hot tubs, steam rooms, or saunas. Wear your seat belt at all times when driving. Avoid raw meat, uncooked cheese, cat litter boxes, and soil used by cats. These carry germs that can cause birth defects in the baby. Take your prenatal vitamins. Try taking a stool softener (if your health care provider approves) if you develop constipation. Eat more high-fiber foods, such as fresh vegetables or fruit and whole grains. Drink plenty of fluids to keep your urine clear or pale yellow. Take warm sitz baths to soothe any pain or discomfort caused by hemorrhoids. Use hemorrhoid cream if your health care provider approves. If you develop varicose veins, wear support hose. Elevate your feet for 15 minutes, 3-4 times a day. Limit salt in your diet. Avoid heavy lifting, wear low heel shoes, and practice good posture. Rest with your legs elevated if you have leg cramps or low back pain. Visit your dentist if you have not gone yet during your pregnancy. Use a soft toothbrush to brush your teeth and be gentle when you floss. A sexual relationship may be continued unless your health care provider directs you otherwise. Continue to go to all your prenatal visits as directed by your health care provider. SEEK MEDICAL CARE IF:  You have dizziness. You have mild pelvic cramps, pelvic pressure, or nagging pain in the abdominal area. You have persistent nausea, vomiting, or diarrhea. You have a bad smelling vaginal discharge. You have pain with urination. SEEK IMMEDIATE MEDICAL CARE IF:  You have a fever. You are leaking fluid from your vagina. You have spotting or bleeding from your vagina. You have severe abdominal cramping or pain. You have rapid weight gain or loss. You have shortness of breath with chest pain. You notice sudden or extreme swelling of your face,  hands, ankles, feet, or legs. You have not felt your baby move in over an hour. You have severe headaches that do not go away with medicine. You have vision changes. Document Released: 11/16/2001 Document Revised: 11/27/2013 Document Reviewed: 01/23/2013 [facility] Patient Information 2015 [place], [place]. This information is not intended to replace advice given to you by your health care provider. Make sure you discuss any questions you have with your health care provider.

## 2023-03-16 NOTE — Progress Notes (Signed)
   LOW-RISK PREGNANCY VISIT Patient name: Tara [name] MRN [id number]  Date of birth: 10-Jan-1996 Chief Complaint:   Routine Prenatal Visit (Headache/ dizziness after stand up period of time)  History of Present Illness:   Tara [name] is a 27 y.o. G11P0101 female at [redacted]w[redacted]d with an Estimated Date of Delivery: 07/09/23 being seen today for ongoing management of a low-risk pregnancy.  Today she reports no complaints. Contractions: Not present.  .  Movement: Present. denies leaking of fluid. Review of Systems:   Pertinent items are noted in HPI Denies abnormal vaginal discharge w/ itching/odor/irritation, headaches, visual changes, shortness of breath, chest pain, abdominal pain, severe nausea/vomiting, or problems with urination or bowel movements unless otherwise stated above. Pertinent History Reviewed:  Reviewed past medical,surgical, social, obstetrical and family history.  Reviewed problem list, medications and allergies. Physical Assessment:   Vitals:   03/16/23 1325  BP: (!) 103/57  Pulse: 91  Weight: 138 lb (62.6 kg)  Body mass index is 28.59 kg/m.        Physical Examination:   General appearance: Well appearing, and in no distress  Mental status: Alert, oriented to person, place, and time  Skin: Warm & dry  Cardiovascular: Normal heart rate noted  Respiratory: Normal respiratory effort, no distress  Abdomen: Soft, gravid, nontender  Pelvic: Cervical exam deferred         Extremities: Edema: None  Fetal Status: Fetal Heart Rate (bpm): 156   Movement: Present    No results found for this or any previous visit (from the past 24 hour(s)).  Assessment & Plan:  1) Low-risk pregnancy G2P0101 at [redacted]w[redacted]d with an Estimated Date of Delivery: 07/09/23   2) Wants BTL>reviewed risks/benefits, discussed high incidence regret <30yo if appropriate, LARCs just as effective, will sign consent at next visit  3) Hx severe pre-e, nl BP today, bASA   Meds: No orders of the defined types were  placed in this encounter.  Labs/procedures today: none  Plan:  Continue routine obstetrical care   Reviewed: Preterm labor symptoms and general obstetric precautions including but not limited to vaginal bleeding, contractions, leaking of fluid and fetal movement were reviewed in detail with the patient.  All questions were answered. Has home bp cuff. Check bp weekly, let us know if >140/90.   Follow-up: Return in about 4 weeks (around 04/13/2023) for LROB, PN2, in person.  No orders of the defined types were placed in this encounter.  Arabella Merles CNM 03/16/2023 1:47 PM

## 2023-04-14 ENCOUNTER — Encounter: Payer: Self-pay | Admitting: Advanced Practice Midwife

## 2023-04-14 ENCOUNTER — Ambulatory Visit (INDEPENDENT_AMBULATORY_CARE_PROVIDER_SITE_OTHER): Payer: Medicaid Other | Admitting: Advanced Practice Midwife

## 2023-04-14 ENCOUNTER — Other Ambulatory Visit: Payer: Medicaid Other

## 2023-04-14 VITALS — BP 102/60 | HR 91 | Wt 145.0 lb

## 2023-04-14 DIAGNOSIS — Z3480 Encounter for supervision of other normal pregnancy, unspecified trimester: Secondary | ICD-10-CM

## 2023-04-14 DIAGNOSIS — Z3A27 27 weeks gestation of pregnancy: Secondary | ICD-10-CM

## 2023-04-14 DIAGNOSIS — Z131 Encounter for screening for diabetes mellitus: Secondary | ICD-10-CM

## 2023-04-14 DIAGNOSIS — Z23 Encounter for immunization: Secondary | ICD-10-CM | POA: Diagnosis not present

## 2023-04-14 DIAGNOSIS — Z302 Encounter for sterilization: Secondary | ICD-10-CM

## 2023-04-14 DIAGNOSIS — Z3482 Encounter for supervision of other normal pregnancy, second trimester: Secondary | ICD-10-CM

## 2023-04-14 NOTE — Patient Instructions (Signed)
[name], I greatly value your feedback.  If you receive a survey following your visit with [place] today, we appreciate you taking the time to fill it out.  Thanks, Cathie Beams, CNM   [facility] HAS MOVED!!! It is now [facility] at [facility] ([address] [place], [place] [address]) Entrance located off of [address] Free 24/7 valet parking   Go to [email or web address] to register for FREE online childbirth classes    Call the office [phone]) or go to [facility] if: You begin to have strong, frequent contractions Your water breaks.  Sometimes it is a big gush of fluid, sometimes it is just a trickle that keeps getting your panties wet or running down your legs You have vaginal bleeding.  It is normal to have a small amount of spotting if your cervix was checked.  You don't feel your baby moving like normal.  If you don't, get you something to eat and drink and lay down and focus on feeling your baby move.  You should feel at least 10 movements in 2 hours.  If you don't, you should call the office or go to [facility].    Tdap Vaccine It is recommended that you get the Tdap vaccine during the third trimester of EACH pregnancy to help protect your baby from getting pertussis (whooping cough) 27-36 weeks is the BEST time to do this so that you can pass the protection on to your baby. During pregnancy is better than after pregnancy, but if you are unable to get it during pregnancy it will be offered at the hospital.  You will be offered this vaccine in the office after 27 weeks. If you do not have health insurance, you can get this vaccine at the health department or your family doctor Everyone who will be around your baby should also be up-to-date on their vaccines. Adults (who are not pregnant) only need 1 dose of Tdap during adulthood.   Third Trimester of Pregnancy The third trimester is from week 29 through week 42, months 7 through 9. The third  trimester is a time when the fetus is growing rapidly. At the end of the ninth month, the fetus is about 20 inches in length and weighs 6-10 pounds.  BODY CHANGES Your body goes through many changes during pregnancy. The changes vary from woman to woman.  Your weight will continue to increase. You can expect to gain 25-35 pounds (11-16 kg) by the end of the pregnancy. You may begin to get stretch marks on your hips, abdomen, and breasts. You may urinate more often because the fetus is moving lower into your pelvis and pressing on your bladder. You may develop or continue to have heartburn as a result of your pregnancy. You may develop constipation because certain hormones are causing the muscles that push waste through your intestines to slow down. You may develop hemorrhoids or swollen, bulging veins (varicose veins). You may have pelvic pain because of the weight gain and pregnancy hormones relaxing your joints between the bones in your pelvis. Backaches may result from overexertion of the muscles supporting your posture. You may have changes in your hair. These can include thickening of your hair, rapid growth, and changes in texture. Some women also have hair loss during or after pregnancy, or hair that feels dry or thin. Your hair will most likely return to normal after your baby is born. Your breasts will continue to grow and be tender. A yellow  discharge may leak from your breasts called colostrum. Your belly button may stick out. You may feel short of breath because of your expanding uterus. You may notice the fetus "dropping," or moving lower in your abdomen. You may have a bloody mucus discharge. This usually occurs a few days to a week before labor begins. Your cervix becomes thin and soft (effaced) near your due date. WHAT TO EXPECT AT YOUR PRENATAL EXAMS  You will have prenatal exams every 2 weeks until week 36. Then, you will have weekly prenatal exams. During a routine prenatal  visit: You will be weighed to make sure you and the fetus are growing normally. Your blood pressure is taken. Your abdomen will be measured to track your baby's growth. The fetal heartbeat will be listened to. Any test results from the previous visit will be discussed. You may have a cervical check near your due date to see if you have effaced. At around 36 weeks, your caregiver will check your cervix. At the same time, your caregiver will also perform a test on the secretions of the vaginal tissue. This test is to determine if a type of bacteria, Group B streptococcus, is present. Your caregiver will explain this further. Your caregiver may ask you: What your birth plan is. How you are feeling. If you are feeling the baby move. If you have had any abnormal symptoms, such as leaking fluid, bleeding, severe headaches, or abdominal cramping. If you have any questions. Other tests or screenings that may be performed during your third trimester include: Blood tests that check for low iron levels (anemia). Fetal testing to check the health, activity level, and growth of the fetus. Testing is done if you have certain medical conditions or if there are problems during the pregnancy. FALSE LABOR You may feel small, irregular contractions that eventually go away. These are called Braxton Hicks contractions, or false labor. Contractions may last for hours, days, or even weeks before true labor sets in. If contractions come at regular intervals, intensify, or become painful, it is best to be seen by your caregiver.  SIGNS OF LABOR  Menstrual-like cramps. Contractions that are 5 minutes apart or less. Contractions that start on the top of the uterus and spread down to the lower abdomen and back. A sense of increased pelvic pressure or back pain. A watery or bloody mucus discharge that comes from the vagina. If you have any of these signs before the 37th week of pregnancy, call your caregiver right away.  You need to go to the hospital to get checked immediately. HOME CARE INSTRUCTIONS  Avoid all smoking, herbs, alcohol, and unprescribed drugs. These chemicals affect the formation and growth of the baby. Follow your caregiver's instructions regarding medicine use. There are medicines that are either safe or unsafe to take during pregnancy. Exercise only as directed by your caregiver. Experiencing uterine cramps is a good sign to stop exercising. Continue to eat regular, healthy meals. Wear a good support bra for breast tenderness. Do not use hot tubs, steam rooms, or saunas. Wear your seat belt at all times when driving. Avoid raw meat, uncooked cheese, cat litter boxes, and soil used by cats. These carry germs that can cause birth defects in the baby. Take your prenatal vitamins. Try taking a stool softener (if your caregiver approves) if you develop constipation. Eat more high-fiber foods, such as fresh vegetables or fruit and whole grains. Drink plenty of fluids to keep your urine clear or pale yellow. Take  warm sitz baths to soothe any pain or discomfort caused by hemorrhoids. Use hemorrhoid cream if your caregiver approves. If you develop varicose veins, wear support hose. Elevate your feet for 15 minutes, 3-4 times a day. Limit salt in your diet. Avoid heavy lifting, wear low heal shoes, and practice good posture. Rest a lot with your legs elevated if you have leg cramps or low back pain. Visit your dentist if you have not gone during your pregnancy. Use a soft toothbrush to brush your teeth and be gentle when you floss. A sexual relationship may be continued unless your caregiver directs you otherwise. Do not travel far distances unless it is absolutely necessary and only with the approval of your caregiver. Take prenatal classes to understand, practice, and ask questions about the labor and delivery. Make a trial run to the hospital. Pack your hospital bag. Prepare the baby's  nursery. Continue to go to all your prenatal visits as directed by your caregiver. SEEK MEDICAL CARE IF: You are unsure if you are in labor or if your water has broken. You have dizziness. You have mild pelvic cramps, pelvic pressure, or nagging pain in your abdominal area. You have persistent nausea, vomiting, or diarrhea. You have a bad smelling vaginal discharge. You have pain with urination. SEEK IMMEDIATE MEDICAL CARE IF:  You have a fever. You are leaking fluid from your vagina. You have spotting or bleeding from your vagina. You have severe abdominal cramping or pain. You have rapid weight loss or gain. You have shortness of breath with chest pain. You notice sudden or extreme swelling of your face, hands, ankles, feet, or legs. You have not felt your baby move in over an hour. You have severe headaches that do not go away with medicine. You have vision changes. Document Released: 11/16/2001 Document Revised: 11/27/2013 Document Reviewed: 01/23/2013 [facility] Patient Information 2015 [place], [place]. This information is not intended to replace advice given to you by your health care provider. Make sure you discuss any questions you have with your health care provider.

## 2023-04-14 NOTE — Progress Notes (Signed)
   LOW-RISK PREGNANCY VISIT Patient name: Tara [name] MRN [id number]  Date of birth: 30-Nov-1996 Chief Complaint:   Routine Prenatal Visit (PN2)  History of Present Illness:   Tara [name] is a 27 y.o. G11P0101 female at 104w5d with an Estimated Date of Delivery: 07/09/23 being seen today for ongoing management of a low-risk pregnancy.  Today she reports no complaints.  Has a bump under right armpit, tender . Looks like a cyst, not infected. Contractions: Not present. Vag. Bleeding: None.  Movement: Present. denies leaking of fluid. Review of Systems:   Pertinent items are noted in HPI Denies abnormal vaginal discharge w/ itching/odor/irritation, headaches, visual changes, shortness of breath, chest pain, abdominal pain, severe nausea/vomiting, or problems with urination or bowel movements unless otherwise stated above. Pertinent History Reviewed:  Reviewed past medical,surgical, social, obstetrical and family history.  Reviewed problem list, medications and allergies. Physical Assessment:   Vitals:   04/14/23 0923  BP: 102/60  Pulse: 91  Weight: 145 lb (65.8 kg)  Body mass index is 30.05 kg/m.        Physical Examination:   General appearance: Well appearing, and in no distress  Mental status: Alert, oriented to person, place, and time  Skin: Warm & dry  Cardiovascular: Normal heart rate noted  Respiratory: Normal respiratory effort, no distress  Abdomen: Soft, gravid, nontender  Pelvic: Cervical exam deferred         Extremities: Edema: None  Fetal Status: Fetal Heart Rate (bpm): 142 Fundal Height: 28 cm Movement: Present    Chaperone:  N/A    No results found for this or any previous visit (from the past 24 hour(s)).  Assessment & Plan:    Pregnancy: G2P0101 at 103w5d 1. Supervision of other normal pregnancy, antepartum  - Tdap vaccine greater than or equal to 7yo IM  2. [redacted] weeks gestation of pregnancy  - Tdap vaccine greater than or equal to 7yo IM     Meds:  No orders of the defined types were placed in this encounter.  Labs/procedures today: PN2  Plan:  Continue routine obstetrical care  Next visit: prefers in person    Reviewed: Preterm labor symptoms and general obstetric precautions including but not limited to vaginal bleeding, contractions, leaking of fluid and fetal movement were reviewed in detail with the patient.  All questions were answered. Has home bp cuff. Check bp weekly, let us know if >140/90.   Follow-up: Return in about 3 weeks (around 05/05/2023) for LROB.  No future appointments.   Orders Placed This Encounter  Procedures   Tdap vaccine greater than or equal to 7yo IM   Jacklyn Shell DNP, CNM 04/14/2023 10:03 AM

## 2023-04-15 LAB — CBC
Hematocrit: 32 % — ABNORMAL LOW (ref 34.0–46.6)
Hemoglobin: 10.6 g/dL — ABNORMAL LOW (ref 11.1–15.9)
MCH: 29.4 pg (ref 26.6–33.0)
MCHC: 33.1 g/dL (ref 31.5–35.7)
MCV: 89 fL (ref 79–97)
Platelets: 212 10*3/uL (ref 150–450)
RBC: 3.6 x10E6/uL — ABNORMAL LOW (ref 3.77–5.28)
RDW: 12.2 % (ref 11.7–15.4)
WBC: 11.3 10*3/uL — ABNORMAL HIGH (ref 3.4–10.8)

## 2023-04-15 LAB — GLUCOSE TOLERANCE, 2 HOURS W/ 1HR
Glucose, 1 hour: 112 mg/dL (ref 70–179)
Glucose, 2 hour: 102 mg/dL (ref 70–152)
Glucose, Fasting: 85 mg/dL (ref 70–91)

## 2023-04-15 LAB — HIV ANTIBODY (ROUTINE TESTING W REFLEX): HIV Screen 4th Generation wRfx: NONREACTIVE

## 2023-04-15 LAB — ANTIBODY SCREEN: Antibody Screen: NEGATIVE

## 2023-04-15 LAB — RPR: RPR Ser Ql: NONREACTIVE

## 2023-05-05 ENCOUNTER — Ambulatory Visit (INDEPENDENT_AMBULATORY_CARE_PROVIDER_SITE_OTHER): Payer: Medicaid Other | Admitting: Advanced Practice Midwife

## 2023-05-05 ENCOUNTER — Encounter: Payer: Self-pay | Admitting: Advanced Practice Midwife

## 2023-05-05 VITALS — BP 107/64 | HR 90 | Wt 148.0 lb

## 2023-05-05 DIAGNOSIS — Z3480 Encounter for supervision of other normal pregnancy, unspecified trimester: Secondary | ICD-10-CM

## 2023-05-05 DIAGNOSIS — Z3A30 30 weeks gestation of pregnancy: Secondary | ICD-10-CM

## 2023-05-05 DIAGNOSIS — Z3483 Encounter for supervision of other normal pregnancy, third trimester: Secondary | ICD-10-CM

## 2023-05-05 NOTE — Progress Notes (Signed)
   LOW-RISK PREGNANCY VISIT Patient name: Tara [name] MRN [id number]  Date of birth: [birth year] Chief Complaint:   Routine Prenatal Visit  History of Present Illness:   Tara [name] is a 27 y.o. G58P0101 female at [redacted]w[redacted]d with an Estimated Date of Delivery: 07/09/23 being seen today for ongoing management of a low-risk pregnancy.  Today she reports l. Contractions: Not present.  .  Movement: Present. denies leaking of fluid. Review of Systems:   Pertinent items are noted in HPI Denies abnormal vaginal discharge w/ itching/odor/irritation, headaches, visual changes, shortness of breath, chest pain, abdominal pain, severe nausea/vomiting, or problems with urination or bowel movements unless otherwise stated above. Pertinent History Reviewed:  Reviewed past medical,surgical, social, obstetrical and family history.  Reviewed problem list, medications and allergies. Physical Assessment:   Vitals:   05/05/23 1149  BP: 107/64  Pulse: 90  Weight: 148 lb (67.1 kg)  Body mass index is 30.67 kg/m.        Physical Examination:   General appearance: Well appearing, and in no distress  Mental status: Alert, oriented to person, place, and time  Skin: Warm & dry  Cardiovascular: Normal heart rate noted  Respiratory: Normal respiratory effort, no distress  Abdomen: Soft, gravid, nontender  Pelvic: Cervical exam deferred         Extremities: Edema: None  Fetal Status: Fetal Heart Rate (bpm): 147 Fundal Height: 30 cm Movement: Present    Chaperone:  N/A    No results found for this or any previous visit (from the past 24 hour(s)).  Assessment & Plan:    Pregnancy: G2P0101 at [redacted]w[redacted]d 1. Supervision of other normal pregnancy, antepartum   2. [redacted] weeks gestation of pregnancy      Meds: No orders of the defined types were placed in this encounter.  Labs/procedures today: none  Plan:  Continue routine obstetrical care  Next visit: prefers in person    Reviewed: Preterm labor symptoms  and general obstetric precautions including but not limited to vaginal bleeding, contractions, leaking of fluid and fetal movement were reviewed in detail with the patient.  All questions were answered. Has home bp cuff. Check bp weekly, let us know if >140/90.   Follow-up: Return in about 2 weeks (around 05/19/2023) for LROB.  No future appointments.  No orders of the defined types were placed in this encounter.  Jacklyn Shell DNP, CNM 05/05/2023 12:22 PM

## 2023-05-05 NOTE — Patient Instructions (Signed)
[name], I greatly value your feedback.  If you receive a survey following your visit with [place] today, we appreciate you taking the time to fill it out.  Thanks, Cathie Beams, DNP, CNM  [facility] HAS MOVED!!! It is now [facility] at [facility] ([address] [place], [place] [address]) Entrance located off of [address] Free 24/7 valet parking   Go to [email or web address] to register for FREE online childbirth classes    Call the office [phone]) or go to [facility] if: You begin to have strong, frequent contractions Your water breaks.  Sometimes it is a big gush of fluid, sometimes it is just a trickle that keeps getting your panties wet or running down your legs You have vaginal bleeding.  It is normal to have a small amount of spotting if your cervix was checked.  You don't feel your baby moving like normal.  If you don't, get you something to eat and drink and lay down and focus on feeling your baby move.  You should feel at least 10 movements in 2 hours.  If you don't, you should call the office or go to [facility].   Home Blood Pressure Monitoring for Patients   Your provider has recommended that you check your blood pressure (BP) at least once a week at home. If you do not have a blood pressure cuff at home, one will be provided for you. Contact your provider if you have not received your monitor within 1 week.   Helpful Tips for Accurate Home Blood Pressure Checks  Don't smoke, exercise, or drink caffeine 30 minutes before checking your BP Use the restroom before checking your BP (a full bladder can raise your pressure) Relax in a comfortable upright chair Feet on the ground Left arm resting comfortably on a flat surface at the level of your heart Legs uncrossed Back supported Sit quietly and don't talk Place the cuff on your bare arm Adjust snuggly, so that only two fingertips can fit between your skin and the top  of the cuff Check 2 readings separated by at least one minute Keep a log of your BP readings For a visual, please reference this diagram: http://ccnc.care/bpdiagram  Provider Name: [facility]     Phone: [phone]  Zone 1: ALL CLEAR  Continue to monitor your symptoms:  BP reading is less than 140 (top number) or less than 90 (bottom number)  No right upper stomach pain No headaches or seeing spots No feeling nauseated or throwing up No swelling in face and hands  Zone 2: CAUTION Call your doctor's office for any of the following:  BP reading is greater than 140 (top number) or greater than 90 (bottom number)  Stomach pain under your ribs in the middle or right side Headaches or seeing spots Feeling nauseated or throwing up Swelling in face and hands  Zone 3: EMERGENCY  Seek immediate medical care if you have any of the following:  BP reading is greater than160 (top number) or greater than 110 (bottom number) Severe headaches not improving with Tylenol Serious difficulty catching your breath Any worsening symptoms from Zone 2

## 2023-05-18 ENCOUNTER — Encounter: Payer: Self-pay | Admitting: Obstetrics & Gynecology

## 2023-05-18 ENCOUNTER — Ambulatory Visit (INDEPENDENT_AMBULATORY_CARE_PROVIDER_SITE_OTHER): Payer: Medicaid Other | Admitting: Obstetrics & Gynecology

## 2023-05-18 VITALS — BP 100/63 | HR 93 | Wt 153.0 lb

## 2023-05-18 DIAGNOSIS — Z3480 Encounter for supervision of other normal pregnancy, unspecified trimester: Secondary | ICD-10-CM

## 2023-05-18 NOTE — Progress Notes (Signed)
   LOW-RISK PREGNANCY VISIT Patient name: Tara [name] MRN [id number]  Date of birth: 1995/12/29 Chief Complaint:   Routine Prenatal Visit (Nose bleed)  History of Present Illness:   Pollie Poma is a 27 y.o. G55P0101 female at [redacted]w[redacted]d with an Estimated Date of Delivery: 07/09/23 being seen today for ongoing management of a low-risk pregnancy.      04/14/2023    9:24 AM 01/03/2023    3:35 PM 01/26/2022    2:23 PM 01/14/2022    3:17 PM  Depression screen PHQ 2/9  Decreased Interest 1 1 1  0  Down, Depressed, Hopeless 0 0 1 0  PHQ - 2 Score 1 1 2  0  Altered sleeping 2 2 1    Tired, decreased energy 1 1 1    Change in appetite 0 1 1   Feeling bad or failure about yourself  0 0 1   Trouble concentrating 0 0 0   Moving slowly or fidgety/restless 0 0 0   Suicidal thoughts 0 0 0   PHQ-9 Score 4 5 6    Difficult doing work/chores Not difficult at all       Today she reports no complaints. Contractions: Not present.  .  Movement: Present. denies leaking of fluid. Review of Systems:   Pertinent items are noted in HPI Denies abnormal vaginal discharge w/ itching/odor/irritation, headaches, visual changes, shortness of breath, chest pain, abdominal pain, severe nausea/vomiting, or problems with urination or bowel movements unless otherwise stated above. Pertinent History Reviewed:  Reviewed past medical,surgical, social, obstetrical and family history.  Reviewed problem list, medications and allergies.  Physical Assessment:   Vitals:   05/18/23 1119  BP: 100/63  Pulse: 93  Weight: 153 lb (69.4 kg)  Body mass index is 31.7 kg/m.        Physical Examination:   General appearance: Well appearing, and in no distress  Mental status: Alert, oriented to person, place, and time  Skin: Warm & dry  Respiratory: Normal respiratory effort, no distress  Abdomen: Soft, gravid, nontender  Pelvic: Cervical exam deferred         Extremities: Edema: None  Psych:  mood and affect appropriate  Fetal  Status: Fetal Heart Rate (bpm): 150 Fundal Height: 32 cm Movement: Present    Chaperone: n/a    No results found for this or any previous visit (from the past 24 hour(s)).   Assessment & Plan:  1) Low-risk pregnancy G2P0101 at [redacted]w[redacted]d with an Estimated Date of Delivery: 07/09/23   -reviewed contraception and discussed plan for salpingectomy   Meds: No orders of the defined types were placed in this encounter.  Labs/procedures today: none  Plan:  Continue routine obstetrical care  Next visit: prefers in person    Reviewed: Preterm labor symptoms and general obstetric precautions including but not limited to vaginal bleeding, contractions, leaking of fluid and fetal movement were reviewed in detail with the patient.  All questions were answered. Pt has home bp cuff. Check bp weekly, let us know if >140/90.   Follow-up: Return in about 2 weeks (around 06/01/2023) for LROB visit.  No orders of the defined types were placed in this encounter.   Myna Hidalgo, DO Attending Obstetrician & Gynecologist, [facility] for [employer], [facility]

## 2023-06-01 ENCOUNTER — Encounter: Payer: Self-pay | Admitting: Obstetrics and Gynecology

## 2023-06-01 ENCOUNTER — Ambulatory Visit (INDEPENDENT_AMBULATORY_CARE_PROVIDER_SITE_OTHER): Payer: Medicaid Other | Admitting: Obstetrics and Gynecology

## 2023-06-01 VITALS — BP 127/72 | HR 99 | Wt 154.0 lb

## 2023-06-01 DIAGNOSIS — Z3A34 34 weeks gestation of pregnancy: Secondary | ICD-10-CM

## 2023-06-01 DIAGNOSIS — Z3480 Encounter for supervision of other normal pregnancy, unspecified trimester: Secondary | ICD-10-CM

## 2023-06-01 DIAGNOSIS — Z302 Encounter for sterilization: Secondary | ICD-10-CM

## 2023-06-01 DIAGNOSIS — O09299 Supervision of pregnancy with other poor reproductive or obstetric history, unspecified trimester: Secondary | ICD-10-CM

## 2023-06-01 DIAGNOSIS — O09293 Supervision of pregnancy with other poor reproductive or obstetric history, third trimester: Secondary | ICD-10-CM

## 2023-06-01 DIAGNOSIS — Z8751 Personal history of pre-term labor: Secondary | ICD-10-CM

## 2023-06-01 NOTE — Progress Notes (Signed)
Subjective:  Serayah Yazdani is a 27 y.o. G2P0101 at [redacted]w[redacted]d being seen today for ongoing prenatal care.  She is currently monitored for the following issues for this low-risk pregnancy and has Encounter for supervision of normal pregnancy, antepartum; Hx of preeclampsia, prior pregnancy, currently pregnant; History of preterm delivery; Smoker; and Request for sterilization on their problem list.  Patient reports  general discomforts of pregnancy .  Contractions: Not present.  .  Movement: Present. Denies leaking of fluid.   The following portions of the patient's history were reviewed and updated as appropriate: allergies, current medications, past family history, past medical history, past social history, past surgical history and problem list. Problem list updated.  Objective:   Vitals:   06/01/23 1102  BP: 127/72  Pulse: 99  Weight: 154 lb (69.9 kg)    Fetal Status:     Movement: Present     General:  Alert, oriented and cooperative. Patient is in no acute distress.  Skin: Skin is warm and dry. No rash noted.   Cardiovascular: Normal heart rate noted  Respiratory: Normal respiratory effort, no problems with respiration noted  Abdomen: Soft, gravid, appropriate for gestational age. Pain/Pressure: Present     Pelvic:  Cervical exam deferred        Extremities: Normal range of motion.     Mental Status: Normal mood and affect. Normal behavior. Normal judgment and thought content.   Urinalysis:      Assessment and Plan:  Pregnancy: G2P0101 at [redacted]w[redacted]d  1. Supervision of other normal pregnancy, antepartum Stable GBS next visit  2. Hx of preeclampsia, prior pregnancy, currently pregnant No S/Sx at present  3. History of preterm delivery No S/Sx at present  4. Request for sterilization BTL papers signed  Preterm labor symptoms and general obstetric precautions including but not limited to vaginal bleeding, contractions, leaking of fluid and fetal movement were reviewed in detail  with the patient. Please refer to After Visit Summary for other counseling recommendations.  Return in about 2 weeks (around 06/15/2023) for OB visit, face to face, any provider.   Hermina Staggers, MD

## 2023-06-15 ENCOUNTER — Other Ambulatory Visit (HOSPITAL_COMMUNITY): Admission: RE | Admit: 2023-06-15 | Payer: Medicaid Other | Source: Ambulatory Visit

## 2023-06-15 ENCOUNTER — Encounter: Payer: Self-pay | Admitting: Advanced Practice Midwife

## 2023-06-15 ENCOUNTER — Ambulatory Visit (INDEPENDENT_AMBULATORY_CARE_PROVIDER_SITE_OTHER): Payer: Medicaid Other | Admitting: Advanced Practice Midwife

## 2023-06-15 VITALS — BP 110/67 | HR 84 | Wt 159.0 lb

## 2023-06-15 DIAGNOSIS — O26893 Other specified pregnancy related conditions, third trimester: Secondary | ICD-10-CM | POA: Diagnosis not present

## 2023-06-15 DIAGNOSIS — Z3A36 36 weeks gestation of pregnancy: Secondary | ICD-10-CM

## 2023-06-15 DIAGNOSIS — Z3480 Encounter for supervision of other normal pregnancy, unspecified trimester: Secondary | ICD-10-CM

## 2023-06-15 NOTE — Progress Notes (Signed)
   LOW-RISK PREGNANCY VISIT Patient name: Tara [name] MRN [id number]  Date of birth: 02/05/1996 Chief Complaint:   Routine Prenatal Visit (culture)  History of Present Illness:   Tara [name] is a 27 y.o. G57P0101 female at [redacted]w[redacted]d with an Estimated Date of Delivery: 07/09/23 being seen today for ongoing management of a low-risk pregnancy.  Today she reports  palms red and tingling . Contractions: Irregular.  .  Movement: Present. denies leaking of fluid. Review of Systems:   Pertinent items are noted in HPI Denies abnormal vaginal discharge w/ itching/odor/irritation, headaches, visual changes, shortness of breath, chest pain, abdominal pain, severe nausea/vomiting, or problems with urination or bowel movements unless otherwise stated above. Pertinent History Reviewed:  Reviewed past medical,surgical, social, obstetrical and family history.  Reviewed problem list, medications and allergies. Physical Assessment:   Vitals:   06/15/23 1354  BP: 110/67  Pulse: 84  Weight: 159 lb (72.1 kg)  Body mass index is 32.95 kg/m.        Physical Examination:   General appearance: Well appearing, and in no distress  Mental status: Alert, oriented to person, place, and time  Skin: Warm & dry  Cardiovascular: Normal heart rate noted  Respiratory: Normal respiratory effort, no distress  Abdomen: Soft, gravid, nontender  Pelvic: Cervical exam performed  Dilation: Closed Effacement (%): Thick Station: -3  Extremities: Edema: Trace  Fetal Status: Fetal Heart Rate (bpm): 152 Fundal Height: 36 cm Movement: Present Presentation: Vertex  No results found for this or any previous visit (from the past 24 hour(s)).  Assessment & Plan:  1) Low-risk pregnancy G2P0101 at [redacted]w[redacted]d with an Estimated Date of Delivery: 07/09/23   2) Carpal tunnel symptoms, bilat hands tingling/numb; no pain; perimeter of bilat palms are red  3) Hx PPROM/PTD at 36.5, no preterm symptoms currently  4) Hx severe pre-e, dx IP then  persisted PP; BPs stable   Meds: No orders of the defined types were placed in this encounter.  Labs/procedures today: GBS/cultures; SVE  Plan:  Continue routine obstetrical care   Reviewed: Term labor symptoms and general obstetric precautions including but not limited to vaginal bleeding, contractions, leaking of fluid and fetal movement were reviewed in detail with the patient.  All questions were answered. Has home bp cuff. Check bp weekly, let us know if >140/90.   Follow-up: Return for weekly LROB x 3.  Orders Placed This Encounter  Procedures   Culture, beta strep (group b only)   Arabella Merles CNM 06/15/2023 2:20 PM

## 2023-06-17 LAB — CERVICOVAGINAL ANCILLARY ONLY
Chlamydia: NEGATIVE
Comment: NEGATIVE
Comment: NORMAL
Neisseria Gonorrhea: NEGATIVE

## 2023-06-18 LAB — CULTURE, BETA STREP (GROUP B ONLY)

## 2023-06-22 ENCOUNTER — Encounter: Payer: Self-pay | Admitting: Obstetrics & Gynecology

## 2023-06-22 ENCOUNTER — Ambulatory Visit: Payer: Medicaid Other | Admitting: Obstetrics & Gynecology

## 2023-06-22 VITALS — BP 116/77 | HR 86 | Wt 158.0 lb

## 2023-06-22 DIAGNOSIS — Z3480 Encounter for supervision of other normal pregnancy, unspecified trimester: Secondary | ICD-10-CM

## 2023-06-22 DIAGNOSIS — Z3483 Encounter for supervision of other normal pregnancy, third trimester: Secondary | ICD-10-CM

## 2023-06-22 DIAGNOSIS — Z3A37 37 weeks gestation of pregnancy: Secondary | ICD-10-CM

## 2023-06-22 NOTE — Progress Notes (Signed)
   LOW-RISK PREGNANCY VISIT Patient name: Tara [name] MRN [id number]  Date of birth: 25-Feb-1996 Chief Complaint:   Routine Prenatal Visit  History of Present Illness:   Tara [name] is a 27 y.o. G37P0101 female at [redacted]w[redacted]d with an Estimated Date of Delivery: 07/09/23 being seen today for ongoing management of a low-risk pregnancy.     04/14/2023    9:24 AM 01/03/2023    3:35 PM 01/26/2022    2:23 PM 01/14/2022    3:17 PM  Depression screen PHQ 2/9  Decreased Interest 1 1 1  0  Down, Depressed, Hopeless 0 0 1 0  PHQ - 2 Score 1 1 2  0  Altered sleeping 2 2 1    Tired, decreased energy 1 1 1    Change in appetite 0 1 1   Feeling bad or failure about yourself  0 0 1   Trouble concentrating 0 0 0   Moving slowly or fidgety/restless 0 0 0   Suicidal thoughts 0 0 0   PHQ-9 Score 4 5 6    Difficult doing work/chores Not difficult at all       Today she reports no complaints. Contractions: Irritability. Vag. Bleeding: None.  Movement: Present. denies leaking of fluid. Review of Systems:   Pertinent items are noted in HPI Denies abnormal vaginal discharge w/ itching/odor/irritation, headaches, visual changes, shortness of breath, chest pain, abdominal pain, severe nausea/vomiting, or problems with urination or bowel movements unless otherwise stated above. Pertinent History Reviewed:  Reviewed past medical,surgical, social, obstetrical and family history.  Reviewed problem list, medications and allergies. Physical Assessment:   Vitals:   06/22/23 1007  BP: 116/77  Pulse: 86  Weight: 158 lb (71.7 kg)  Body mass index is 32.74 kg/m.        Physical Examination:   General appearance: Well appearing, and in no distress  Mental status: Alert, oriented to person, place, and time  Skin: Warm & dry  Cardiovascular: Normal heart rate noted  Respiratory: Normal respiratory effort, no distress  Abdomen: Soft, gravid, nontender  Pelvic: Cervical exam performed         Extremities:    Fetal  Status:     Movement: Present    Chaperone: Faith Rogue    No results found for this or any previous visit (from the past 24 hour(s)).  Assessment & Plan:  1) Low-risk pregnancy G2P0101 at [redacted]w[redacted]d with an Estimated Date of Delivery: 07/09/23   Continue ASA 162   Meds: No orders of the defined types were placed in this encounter.  Labs/procedures today:   Plan:  Continue routine obstetrical care  Next visit: prefers in person    Reviewed: Term labor symptoms and general obstetric precautions including but not limited to vaginal bleeding, contractions, leaking of fluid and fetal movement were reviewed in detail with the patient.  All questions were answered. Has home bp cuff. Rx faxed to . Check bp weekly, let us know if >140/90.   Follow-up: Return for keep scheduled.  No orders of the defined types were placed in this encounter.   Lazaro Arms, MD 06/22/2023 10:38 AM

## 2023-06-29 ENCOUNTER — Ambulatory Visit (INDEPENDENT_AMBULATORY_CARE_PROVIDER_SITE_OTHER): Payer: Medicaid Other | Admitting: Advanced Practice Midwife

## 2023-06-29 ENCOUNTER — Encounter: Payer: Self-pay | Admitting: Advanced Practice Midwife

## 2023-06-29 VITALS — BP 134/85 | HR 88 | Wt 160.0 lb

## 2023-06-29 DIAGNOSIS — Z3A38 38 weeks gestation of pregnancy: Secondary | ICD-10-CM

## 2023-06-29 DIAGNOSIS — Z3480 Encounter for supervision of other normal pregnancy, unspecified trimester: Secondary | ICD-10-CM

## 2023-06-29 NOTE — Patient Instructions (Signed)
Try the positions on [email or web address] to help the baby get into a good position for labor! 

## 2023-06-29 NOTE — Progress Notes (Signed)
   LOW-RISK PREGNANCY VISIT Patient name: Tara [name] MRN [id number]  Date of birth: [birth year] Chief Complaint:   Routine Prenatal Visit (Cervical check)  History of Present Illness:   Tara [name] is a 27 y.o. G3P0101 female at [redacted]w[redacted]d with an Estimated Date of Delivery: 07/09/23 being seen today for ongoing management of a low-risk pregnancy.  Today she reports  being ready! . Contractions: Irregular.  .  Movement: Present. denies leaking of fluid. Review of Systems:   Pertinent items are noted in HPI Denies abnormal vaginal discharge w/ itching/odor/irritation, headaches, visual changes, shortness of breath, chest pain, abdominal pain, severe nausea/vomiting, or problems with urination or bowel movements unless otherwise stated above. Pertinent History Reviewed:  Reviewed past medical,surgical, social, obstetrical and family history.  Reviewed problem list, medications and allergies. Physical Assessment:   Vitals:   06/29/23 1528  BP: 134/85  Pulse: 88  Weight: 160 lb (72.6 kg)  Body mass index is 33.15 kg/m.        Physical Examination:   General appearance: Well appearing, and in no distress  Mental status: Alert, oriented to person, place, and time  Skin: Warm & dry  Cardiovascular: Normal heart rate noted  Respiratory: Normal respiratory effort, no distress  Abdomen: Soft, gravid, nontender  Pelvic: Cervical exam performed  Dilation: Fingertip Effacement (%): Thick Station: -2  Extremities: Edema: Trace  Fetal Status: Fetal Heart Rate (bpm): 136 Fundal Height: 37 cm Movement: Present Presentation: Vertex  No results found for this or any previous visit (from the past 24 hour(s)).  Assessment & Plan:  1) Low-risk pregnancy G2P0101 at [redacted]w[redacted]d with an Estimated Date of Delivery: 07/09/23   2) Hx severe pre-e, BP up a little today but still normal   Meds: No orders of the defined types were placed in this encounter.  Labs/procedures today: SVE  Plan:  Continue routine  obstetrical care   Reviewed: Term labor symptoms and general obstetric precautions including but not limited to vaginal bleeding, contractions, leaking of fluid and fetal movement were reviewed in detail with the patient.  All questions were answered. Has home bp cuff. Check bp weekly, let us know if >140/90.   Follow-up: Return for As scheduled.  No orders of the defined types were placed in this encounter.  Arabella Merles CNM 06/29/2023 3:54 PM

## 2023-07-06 ENCOUNTER — Encounter: Payer: Self-pay | Admitting: Advanced Practice Midwife

## 2023-07-06 ENCOUNTER — Ambulatory Visit (INDEPENDENT_AMBULATORY_CARE_PROVIDER_SITE_OTHER): Payer: Medicaid Other | Admitting: Advanced Practice Midwife

## 2023-07-06 VITALS — BP 131/80 | HR 93 | Wt 160.0 lb

## 2023-07-06 DIAGNOSIS — Z302 Encounter for sterilization: Secondary | ICD-10-CM

## 2023-07-06 DIAGNOSIS — Z3480 Encounter for supervision of other normal pregnancy, unspecified trimester: Secondary | ICD-10-CM

## 2023-07-06 DIAGNOSIS — Z3A39 39 weeks gestation of pregnancy: Secondary | ICD-10-CM

## 2023-07-06 DIAGNOSIS — Z3490 Encounter for supervision of normal pregnancy, unspecified, unspecified trimester: Secondary | ICD-10-CM

## 2023-07-06 NOTE — Progress Notes (Signed)
   LOW-RISK PREGNANCY VISIT Patient name: Tara [name] MRN [id number]  Date of birth: [birth year] Chief Complaint:   Routine Prenatal Visit  History of Present Illness:   Tara [name] is a 27 y.o. G22P0101 female at [redacted]w[redacted]d with an Estimated Date of Delivery: 07/09/23 being seen today for ongoing management of a low-risk pregnancy.  Today she reports  being ready for delivery; requests eIOL . Contractions: Irregular.  .  Movement: Present. denies leaking of fluid. Review of Systems:   Pertinent items are noted in HPI Denies abnormal vaginal discharge w/ itching/odor/irritation, headaches, visual changes, shortness of breath, chest pain, abdominal pain, severe nausea/vomiting, or problems with urination or bowel movements unless otherwise stated above. Pertinent History Reviewed:  Reviewed past medical,surgical, social, obstetrical and family history.  Reviewed problem list, medications and allergies. Physical Assessment:   Vitals:   07/06/23 1328  BP: 131/80  Pulse: 93  Weight: 160 lb (72.6 kg)  Body mass index is 33.15 kg/m.        Physical Examination:   General appearance: Well appearing, and in no distress  Mental status: Alert, oriented to person, place, and time  Skin: Warm & dry  Cardiovascular: Normal heart rate noted  Respiratory: Normal respiratory effort, no distress  Abdomen: Soft, gravid, nontender  Pelvic: Cervical exam performed  Dilation: Fingertip Effacement (%): 50 Station: -2  Extremities:    Fetal Status: Fetal Heart Rate (bpm): 138   Movement: Present Presentation: Vertex  No results found for this or any previous visit (from the past 24 hour(s)).  Assessment & Plan:  1) Low-risk pregnancy G2P0101 at [redacted]w[redacted]d with an Estimated Date of Delivery: 07/09/23   2) Requests eIOL on [facility], got it set up for 07/09/23 with PD IOL 07/16/23 as backup; for NST 8/5 if preg   Meds: No orders of the defined types were placed in this encounter.  Labs/procedures today:  SVE  Plan:  Continue routine obstetrical care   Reviewed: Term labor symptoms and general obstetric precautions including but not limited to vaginal bleeding, contractions, leaking of fluid and fetal movement were reviewed in detail with the patient.  All questions were answered. Has home bp cuff. Check bp weekly, let us know if >140/90.   Follow-up: Return in about 5 days (around 07/11/2023) for LROB, NST.  No orders of the defined types were placed in this encounter.  Arabella Merles CNM 07/06/2023 2:00 PM

## 2023-07-06 NOTE — Patient Instructions (Signed)
Try the positions on the website [email or web address] to help the baby get positioned for labor!

## 2023-07-08 ENCOUNTER — Telehealth (HOSPITAL_COMMUNITY): Payer: Self-pay | Admitting: *Deleted

## 2023-07-08 ENCOUNTER — Encounter (HOSPITAL_COMMUNITY): Payer: Self-pay | Admitting: *Deleted

## 2023-07-08 NOTE — Telephone Encounter (Signed)
Preadmission screen  

## 2023-07-10 ENCOUNTER — Encounter (HOSPITAL_COMMUNITY): Payer: Self-pay | Admitting: Obstetrics & Gynecology

## 2023-07-10 ENCOUNTER — Other Ambulatory Visit: Payer: Self-pay

## 2023-07-10 ENCOUNTER — Inpatient Hospital Stay (HOSPITAL_COMMUNITY)
Admission: RE | Admit: 2023-07-10 | Discharge: 2023-07-13 | DRG: 798 | Disposition: A | Discharge status: Home / Self Care | Payer: Medicaid Other | Attending: Obstetrics & Gynecology | Admitting: Obstetrics & Gynecology

## 2023-07-10 DIAGNOSIS — Z3490 Encounter for supervision of normal pregnancy, unspecified, unspecified trimester: Secondary | ICD-10-CM

## 2023-07-10 DIAGNOSIS — Z9851 Tubal ligation status: Secondary | ICD-10-CM

## 2023-07-10 DIAGNOSIS — F17210 Nicotine dependence, cigarettes, uncomplicated: Secondary | ICD-10-CM | POA: Diagnosis present

## 2023-07-10 DIAGNOSIS — O134 Gestational [pregnancy-induced] hypertension without significant proteinuria, complicating childbirth: Secondary | ICD-10-CM | POA: Diagnosis present

## 2023-07-10 DIAGNOSIS — Z3A40 40 weeks gestation of pregnancy: Secondary | ICD-10-CM | POA: Diagnosis not present

## 2023-07-10 DIAGNOSIS — O99334 Smoking (tobacco) complicating childbirth: Secondary | ICD-10-CM | POA: Diagnosis not present

## 2023-07-10 DIAGNOSIS — O139 Gestational [pregnancy-induced] hypertension without significant proteinuria, unspecified trimester: Secondary | ICD-10-CM | POA: Insufficient documentation

## 2023-07-10 DIAGNOSIS — O480 Post-term pregnancy: Secondary | ICD-10-CM | POA: Diagnosis not present

## 2023-07-10 DIAGNOSIS — O09299 Supervision of pregnancy with other poor reproductive or obstetric history, unspecified trimester: Secondary | ICD-10-CM

## 2023-07-10 DIAGNOSIS — Z8759 Personal history of other complications of pregnancy, childbirth and the puerperium: Secondary | ICD-10-CM | POA: Insufficient documentation

## 2023-07-10 DIAGNOSIS — O99344 Other mental disorders complicating childbirth: Secondary | ICD-10-CM | POA: Diagnosis not present

## 2023-07-10 DIAGNOSIS — Z7982 Long term (current) use of aspirin: Secondary | ICD-10-CM

## 2023-07-10 DIAGNOSIS — Z302 Encounter for sterilization: Secondary | ICD-10-CM

## 2023-07-10 LAB — CBC
HCT: 34.1 % — ABNORMAL LOW (ref 36.0–46.0)
Hemoglobin: 11.4 g/dL — ABNORMAL LOW (ref 12.0–15.0)
MCH: 28.3 pg (ref 26.0–34.0)
MCHC: 33.4 g/dL (ref 30.0–36.0)
MCV: 84.6 fL (ref 80.0–100.0)
Platelets: 223 10*3/uL (ref 150–400)
RBC: 4.03 MIL/uL (ref 3.87–5.11)
RDW: 13.4 % (ref 11.5–15.5)
WBC: 12 10*3/uL — ABNORMAL HIGH (ref 4.0–10.5)
nRBC: 0.2 % (ref 0.0–0.2)

## 2023-07-10 LAB — TYPE AND SCREEN
ABO/RH(D): O POS
Antibody Screen: NEGATIVE

## 2023-07-10 MED ORDER — OXYCODONE-ACETAMINOPHEN 5-325 MG PO TABS: 2.0000 | ORAL_TABLET | ORAL | Status: DC | PRN

## 2023-07-10 MED ORDER — OXYTOCIN-SODIUM CHLORIDE 30-0.9 UT/500ML-% IV SOLN
2.5000 [IU]/h | INTRAVENOUS | Status: DC
  Filled 2023-07-10: qty 500

## 2023-07-10 MED ORDER — LIDOCAINE HCL (PF) 1 % IJ SOLN: 30.0000 mL | INTRAMUSCULAR | Status: DC | PRN

## 2023-07-10 MED ORDER — LACTATED RINGERS IV SOLN: INTRAVENOUS | Status: DC

## 2023-07-10 MED ORDER — ACETAMINOPHEN 325 MG PO TABS
650.0000 mg | ORAL_TABLET | ORAL | Status: DC | PRN
  Administered 2023-07-11: 650 mg via ORAL
  Filled 2023-07-10: qty 2

## 2023-07-10 MED ORDER — LACTATED RINGERS IV SOLN
500.0000 mL | INTRAVENOUS | Status: DC | PRN
  Administered 2023-07-11: 500 mL via INTRAVENOUS

## 2023-07-10 MED ORDER — MISOPROSTOL 25 MCG QUARTER TABLET
25.0000 ug | ORAL_TABLET | Freq: Once | ORAL | Status: AC
  Administered 2023-07-10: 25 ug via VAGINAL
  Filled 2023-07-10: qty 1

## 2023-07-10 MED ORDER — SOD CITRATE-CITRIC ACID 500-334 MG/5ML PO SOLN: 30.0000 mL | ORAL | Status: DC | PRN

## 2023-07-10 MED ORDER — FENTANYL CITRATE (PF) 100 MCG/2ML IJ SOLN
100.0000 ug | INTRAMUSCULAR | Status: DC | PRN
  Administered 2023-07-11 (×2): 100 ug via INTRAVENOUS
  Filled 2023-07-10 (×2): qty 2

## 2023-07-10 MED ORDER — OXYTOCIN BOLUS FROM INFUSION
333.0000 mL | Freq: Once | INTRAVENOUS | Status: AC
  Administered 2023-07-11: 333 mL via INTRAVENOUS

## 2023-07-10 MED ORDER — ONDANSETRON HCL 4 MG/2ML IJ SOLN
4.0000 mg | Freq: Four times a day (QID) | INTRAMUSCULAR | Status: DC | PRN
  Administered 2023-07-11: 4 mg via INTRAVENOUS
  Filled 2023-07-10: qty 2

## 2023-07-10 MED ORDER — OXYCODONE-ACETAMINOPHEN 5-325 MG PO TABS: 1.0000 | ORAL_TABLET | ORAL | Status: DC | PRN

## 2023-07-10 MED ORDER — MISOPROSTOL 50MCG HALF TABLET
50.0000 ug | ORAL_TABLET | Freq: Once | ORAL | Status: AC
  Administered 2023-07-10: 50 ug via ORAL
  Filled 2023-07-10: qty 1

## 2023-07-10 MED ORDER — TERBUTALINE SULFATE 1 MG/ML IJ SOLN: 0.2500 mg | Freq: Once | INTRAMUSCULAR | Status: DC | PRN

## 2023-07-10 NOTE — H&P (Signed)
OBSTETRIC ADMISSION HISTORY AND PHYSICAL  Tara [name] is a 27 y.o. female G51P0101 with IUP at [redacted]w[redacted]d by early [place] presenting for eIOL. She plans on breast feeding. She request BTL for birth control. She [facility] at  [facility]    Dating: By 9wk [place] --->  Estimated Date of Delivery: 07/09/23  Sono:    @[redacted]w[redacted]d , CWD, normal anatomy, cephalic presentation, anterior placenta, 305g, 49% EFW   Prenatal History/Complications:  -hx of preE -hx of PTD -Current smoker  Past Medical History: Past Medical History:  Diagnosis Date   Hx of preeclampsia, prior pregnancy, currently pregnant 01/03/2023   Pre-eclampsia     Past Surgical History: Past Surgical History:  Procedure Laterality Date   NO PAST SURGERIES     WISDOM TOOTH EXTRACTION      Obstetrical History: OB History     Gravida  2   Para  1   Term      Preterm  1   AB      Living  1      SAB      IAB      Ectopic      Multiple  0   Live Births  1           Social History Social History   Socioeconomic History   Marital status: Married    Spouse name: Not on file   Number of children: Not on file   Years of education: Not on file   Highest education level: Not on file  Occupational History   Not on file  Tobacco Use   Smoking status: Every Day    Current packs/day: 0.25    Average packs/day: 0.3 packs/day for 10.0 years (2.5 ttl pk-yrs)    Types: Cigarettes   Smokeless tobacco: Never   Tobacco comments:    smokes 5-10 cig daily  Vaping Use   Vaping status: Never Used  Substance and Sexual Activity   Alcohol use: No   Drug use: No   Sexual activity: Yes    Partners: Male    Birth control/protection: None  Other Topics Concern   Not on file  Social History Narrative   Not on file   Social Determinants of Health   Financial Resource Strain: Low Risk  (01/03/2023)   Overall Financial Resource Strain (CARDIA)    Difficulty of Paying Living Expenses: Not hard at all  Food  Insecurity: No Food Insecurity (07/10/2023)   Hunger Vital Sign    Worried About Running Out of Food in the Last Year: Never true    Ran Out of Food in the Last Year: Never true  Transportation Needs: No Transportation Needs (07/10/2023)   PRAPARE - Administrator, Civil Service (Medical): No    Lack of Transportation (Non-Medical): No  Physical Activity: Inactive (01/03/2023)   Exercise Vital Sign    Days of Exercise per Week: 0 days    Minutes of Exercise per Session: 0 min  Stress: No Stress Concern Present (01/03/2023)   [employer] of Occupational Health - Occupational Stress Questionnaire    Feeling of Stress : Only a little  Social Connections: Moderately Isolated (01/03/2023)   Social Connection and Isolation Panel [NHANES]    Frequency of Communication with Friends and Family: Three times a week    Frequency of Social Gatherings with Friends and Family: Once a week    Attends Religious Services: Never    Database administrator or Organizations: No  Attends Banker Meetings: Not on file    Marital Status: Married    Family History: Family History  Problem Relation Age of Onset   Depression Maternal Grandmother    Arthritis Maternal Grandmother    Hypertension Maternal Grandmother    Lung cancer Maternal Grandfather    Heart attack Paternal Grandfather    Dementia Maternal Great-grandmother     Allergies: No Known Allergies  Medications Prior to Admission  Medication Sig Dispense Refill Last Dose   aspirin EC 81 MG tablet Take 2 tablets (162 mg total) by mouth daily. Swallow whole. 180 tablet 2    diphenhydramine-acetaminophen (TYLENOL PM) 25-500 MG TABS tablet Take 1 tablet by mouth at bedtime as needed.      Prenatal Vit-Fe Fumarate-FA (PRENATAL VITAMIN PO) Take by mouth.        Review of Systems   All systems reviewed and negative except as stated in HPI  Blood pressure 125/80, pulse 87, temperature 98 F (36.7 C), temperature  source Oral, resp. rate 14, height 4\' 10"  (1.473 m), weight 71.8 kg, SpO2 98%. General appearance: alert and no distress Lungs: normal effort Heart: regular rate noted Abdomen: gravid Presentation: cephalic per RN exam Fetal monitoringBaseline: 130 bpm, Variability: Good {> 6 bpm), Accelerations: Reactive, and Decelerations: Absent Uterine activity irritability Dilation: Fingertip Effacement (%): 60 Exam by:: [place] Turner, RN  Prenatal labs: ABO, Rh: --/--/O POS (08/04 2113) Antibody: NEG (08/04 2113) Rubella: 1.49 (01/29 1647) RPR: Non Reactive (05/09 0900)  HBsAg: Negative (01/29 1647)  HIV: Non Reactive (05/09 0900)  GBS: Negative/-- (07/10 1400)  1 hr Glucola 112 Genetic screening  Neg, LR female Anatomy US wnl  Prenatal Transfer Tool  Maternal Diabetes: No Genetic Screening: Normal Maternal Ultrasounds/Referrals: Normal Fetal Ultrasounds or other Referrals:  None Maternal Substance Abuse:  Yes:  Type: Smoker Significant Maternal Medications:  None Significant Maternal Lab Results:  Group B Strep negative Number of Prenatal Visits:greater than 3 verified prenatal visits Other Comments:  None  Results for orders placed or performed during the hospital encounter of 07/10/23 (from the past 24 hour(s))  CBC   Collection Time: 07/10/23  9:13 PM  Result Value Ref Range   WBC 12.0 (H) 4.0 - 10.5 K/uL   RBC 4.03 3.87 - 5.11 MIL/uL   Hemoglobin 11.4 (L) 12.0 - 15.0 g/dL   HCT 16.1 (L) 09.6 - 04.5 %   MCV 84.6 80.0 - 100.0 fL   MCH 28.3 26.0 - 34.0 pg   MCHC 33.4 30.0 - 36.0 g/dL   RDW 40.9 81.1 - 91.4 %   Platelets 223 150 - 400 K/uL   nRBC 0.2 0.0 - 0.2 %  Type and screen   Collection Time: 07/10/23  9:13 PM  Result Value Ref Range   ABO/RH(D) O POS    Antibody Screen NEG    Sample Expiration      07/13/2023,2359 Performed at [facility], [address], [place], [place] [address]     Patient Active Problem List   Diagnosis Date Noted   Encounter for  elective induction of labor 07/10/2023   Request for sterilization 03/16/2023   Encounter for supervision of normal pregnancy, antepartum 01/03/2023   Hx of preeclampsia, prior pregnancy, currently pregnant 01/03/2023   History of preterm delivery 01/03/2023   Smoker 01/03/2023    Assessment/Plan:  Tara [name] is a 27 y.o. G2P0101 at [redacted]w[redacted]d here for eIOL.   #Labor: Cyto 50/25 given. Assess for FB at next cervical  exam.  #Pain: Maternal request, planning for epidural #FWB: Cat I  #ID: GBS neg #MOF: Breast #MOC: BTL following delivery  Tara Bynum Autry-Lott, DO  07/10/2023, 10:56 PM

## 2023-07-11 ENCOUNTER — Inpatient Hospital Stay (HOSPITAL_COMMUNITY): Payer: Medicaid Other | Admitting: Anesthesiology

## 2023-07-11 ENCOUNTER — Encounter: Payer: Medicaid Other | Admitting: Obstetrics and Gynecology

## 2023-07-11 ENCOUNTER — Encounter (HOSPITAL_COMMUNITY): Payer: Self-pay | Admitting: Obstetrics & Gynecology

## 2023-07-11 ENCOUNTER — Other Ambulatory Visit: Payer: Medicaid Other

## 2023-07-11 DIAGNOSIS — O99344 Other mental disorders complicating childbirth: Secondary | ICD-10-CM

## 2023-07-11 DIAGNOSIS — O480 Post-term pregnancy: Secondary | ICD-10-CM

## 2023-07-11 DIAGNOSIS — O99334 Smoking (tobacco) complicating childbirth: Secondary | ICD-10-CM

## 2023-07-11 DIAGNOSIS — Z3A40 40 weeks gestation of pregnancy: Secondary | ICD-10-CM

## 2023-07-11 DIAGNOSIS — O134 Gestational [pregnancy-induced] hypertension without significant proteinuria, complicating childbirth: Secondary | ICD-10-CM

## 2023-07-11 MED ORDER — WITCH HAZEL-GLYCERIN EX PADS
1.0000 | MEDICATED_PAD | CUTANEOUS | Status: DC | PRN
  Administered 2023-07-13: 1 via TOPICAL

## 2023-07-11 MED ORDER — PRENATAL MULTIVITAMIN CH
1.0000 | ORAL_TABLET | Freq: Every day | ORAL | Status: DC
  Administered 2023-07-13: 1 via ORAL
  Filled 2023-07-11: qty 1

## 2023-07-11 MED ORDER — PHENYLEPHRINE 80 MCG/ML (10ML) SYRINGE FOR IV PUSH (FOR BLOOD PRESSURE SUPPORT)
80.0000 ug | PREFILLED_SYRINGE | INTRAVENOUS | Status: DC | PRN
  Filled 2023-07-11: qty 10

## 2023-07-11 MED ORDER — ZOLPIDEM TARTRATE 5 MG PO TABS: 5.0000 mg | ORAL_TABLET | Freq: Every evening | ORAL | Status: DC | PRN

## 2023-07-11 MED ORDER — ONDANSETRON HCL 4 MG/2ML IJ SOLN
4.0000 mg | INTRAMUSCULAR | Status: DC | PRN
  Administered 2023-07-12: 4 mg via INTRAVENOUS

## 2023-07-11 MED ORDER — BENZOCAINE-MENTHOL 20-0.5 % EX AERO: 1.0000 | INHALATION_SPRAY | CUTANEOUS | Status: DC | PRN

## 2023-07-11 MED ORDER — METOCLOPRAMIDE HCL 10 MG PO TABS
10.0000 mg | ORAL_TABLET | Freq: Once | ORAL | Status: AC
  Administered 2023-07-12: 10 mg via ORAL
  Filled 2023-07-11: qty 1

## 2023-07-11 MED ORDER — IBUPROFEN 600 MG PO TABS
600.0000 mg | ORAL_TABLET | Freq: Four times a day (QID) | ORAL | Status: DC
  Administered 2023-07-11 – 2023-07-13 (×8): 600 mg via ORAL
  Filled 2023-07-11 (×8): qty 1

## 2023-07-11 MED ORDER — EPHEDRINE 5 MG/ML INJ: 10.0000 mg | INTRAVENOUS | Status: DC | PRN

## 2023-07-11 MED ORDER — LACTATED RINGERS IV SOLN: INTRAVENOUS | Status: DC

## 2023-07-11 MED ORDER — DIPHENHYDRAMINE HCL 25 MG PO CAPS: 25.0000 mg | ORAL_CAPSULE | Freq: Four times a day (QID) | ORAL | Status: DC | PRN

## 2023-07-11 MED ORDER — DIBUCAINE (PERIANAL) 1 % EX OINT: 1.0000 | TOPICAL_OINTMENT | CUTANEOUS | Status: DC | PRN

## 2023-07-11 MED ORDER — LACTATED RINGERS IV SOLN: 500.0000 mL | Freq: Once | INTRAVENOUS | Status: DC

## 2023-07-11 MED ORDER — ACETAMINOPHEN 325 MG PO TABS
325.0000 mg | ORAL_TABLET | Freq: Four times a day (QID) | ORAL | Status: DC | PRN
  Administered 2023-07-11: 325 mg via ORAL
  Filled 2023-07-11: qty 1

## 2023-07-11 MED ORDER — PHENYLEPHRINE 80 MCG/ML (10ML) SYRINGE FOR IV PUSH (FOR BLOOD PRESSURE SUPPORT): 80.0000 ug | PREFILLED_SYRINGE | INTRAVENOUS | Status: DC | PRN

## 2023-07-11 MED ORDER — FENTANYL-BUPIVACAINE-NACL 0.5-0.125-0.9 MG/250ML-% EP SOLN
12.0000 mL/h | EPIDURAL | Status: DC | PRN
  Administered 2023-07-11: 12 mL/h via EPIDURAL
  Filled 2023-07-11: qty 250

## 2023-07-11 MED ORDER — LIDOCAINE HCL (PF) 1 % IJ SOLN
INTRAMUSCULAR | Status: DC | PRN
  Administered 2023-07-11 (×2): 4 mL via EPIDURAL

## 2023-07-11 MED ORDER — SIMETHICONE 80 MG PO CHEW: 80.0000 mg | CHEWABLE_TABLET | ORAL | Status: DC | PRN

## 2023-07-11 MED ORDER — OXYTOCIN-SODIUM CHLORIDE 30-0.9 UT/500ML-% IV SOLN
1.0000 m[IU]/min | INTRAVENOUS | Status: DC
  Administered 2023-07-11: 2 m[IU]/min via INTRAVENOUS

## 2023-07-11 MED ORDER — TETANUS-DIPHTH-ACELL PERTUSSIS 5-2.5-18.5 LF-MCG/0.5 IM SUSY: 0.5000 mL | PREFILLED_SYRINGE | Freq: Once | INTRAMUSCULAR | Status: DC

## 2023-07-11 MED ORDER — ACETAMINOPHEN 325 MG PO TABS
650.0000 mg | ORAL_TABLET | ORAL | Status: DC | PRN
  Administered 2023-07-12 – 2023-07-13 (×5): 650 mg via ORAL
  Filled 2023-07-11 (×5): qty 2

## 2023-07-11 MED ORDER — SENNOSIDES-DOCUSATE SODIUM 8.6-50 MG PO TABS
2.0000 | ORAL_TABLET | Freq: Every day | ORAL | Status: DC
  Administered 2023-07-13: 2 via ORAL
  Filled 2023-07-11: qty 2

## 2023-07-11 MED ORDER — COCONUT OIL OIL: 1.0000 | TOPICAL_OIL | Status: DC | PRN

## 2023-07-11 MED ORDER — DIPHENHYDRAMINE HCL 50 MG/ML IJ SOLN: 12.5000 mg | INTRAMUSCULAR | Status: DC | PRN

## 2023-07-11 MED ORDER — FAMOTIDINE 20 MG PO TABS
40.0000 mg | ORAL_TABLET | Freq: Once | ORAL | Status: AC
  Administered 2023-07-12: 40 mg via ORAL
  Filled 2023-07-11 (×2): qty 2

## 2023-07-11 MED ORDER — ONDANSETRON HCL 4 MG PO TABS
4.0000 mg | ORAL_TABLET | ORAL | Status: DC | PRN
  Administered 2023-07-13: 4 mg via ORAL
  Filled 2023-07-11: qty 1

## 2023-07-11 NOTE — Anesthesia Preprocedure Evaluation (Signed)
Anesthesia Evaluation  Patient identified by MRN, date of birth, ID band Patient awake    Reviewed: Allergy & Precautions, Patient's Chart, lab work & pertinent test results  History of Anesthesia Complications Negative for: history of anesthetic complications  Airway Mallampati: II  TM Distance: >3 FB Neck ROM: Full    Dental no notable dental hx.    Pulmonary Current Smoker   Pulmonary exam normal        Cardiovascular negative cardio ROS Normal cardiovascular exam     Neuro/Psych negative neurological ROS     GI/Hepatic negative GI ROS, Neg liver ROS,,,  Endo/Other  negative endocrine ROS    Renal/GU negative Renal ROS     Musculoskeletal negative musculoskeletal ROS (+)    Abdominal   Peds  Hematology negative hematology ROS (+)   Anesthesia Other Findings Day of surgery medications reviewed with patient.  Reproductive/Obstetrics (+) Pregnancy                              Anesthesia Physical Anesthesia Plan  ASA: 2  Anesthesia Plan: Epidural   Post-op Pain Management:    Induction:   PONV Risk Score and Plan: Treatment may vary due to age or medical condition  Airway Management Planned: Natural Airway  Additional Equipment: Fetal Monitoring  Intra-op Plan:   Post-operative Plan:   Informed Consent: I have reviewed the patients History and Physical, chart, labs and discussed the procedure including the risks, benefits and alternatives for the proposed anesthesia with the patient or authorized representative who has indicated his/her understanding and acceptance.       Plan Discussed with:   Anesthesia Plan Comments:          Anesthesia Quick Evaluation

## 2023-07-11 NOTE — Progress Notes (Signed)
Labor Progress Note Kou Molinares is a 27 y.o. G2P0101 at [redacted]w[redacted]d presented for eIOL.   S: Having some discomfort with contractions.   O:  BP 125/80 (BP Location: Right Arm)   Pulse 87   Temp 98 F (36.7 C) (Oral)   Resp 14   Ht 4\' 10"  (1.473 m)   Wt 71.8 kg   SpO2 98%   BMI 33.06 kg/m  EFM: 125bpm/moderate/+accels, no decels  CVE: Dilation: 3 Effacement (%): 80 Station: -2 Presentation: Vertex Exam by:: Autry-Lott   [name]: 27 y.o. [id number] [redacted]w[redacted]d here for eIOL.  #Labor: Progressing well. S/p dual cytotec with regular contractions. AROM, scant clear fluid. Consider the need for pitocin if contraction space out.  #Pain: Maternally supported #FWB: Cat I  #GBS negative   Cherita Hebel Autry-Lott, DO 3:04 AM

## 2023-07-11 NOTE — Anesthesia Procedure Notes (Signed)
Epidural Patient location during procedure: OB Start time: 07/11/2023 5:38 AM End time: 07/11/2023 5:43 AM  Staffing Anesthesiologist: Kaylyn Layer, MD Performed: anesthesiologist   Preanesthetic Checklist Completed: patient identified, IV checked, risks and benefits discussed, monitors and equipment checked, pre-op evaluation and timeout performed  Epidural Patient position: sitting Prep: DuraPrep and site prepped and draped Patient monitoring: continuous pulse ox, blood pressure and heart rate Approach: midline Location: L2-L3 Injection technique: LOR air  Needle:  Needle type: Tuohy  Needle gauge: 17 G Needle length: 9 cm Needle insertion depth: 6 cm Catheter type: closed end flexible Catheter size: 19 Gauge Catheter at skin depth: 11 cm Test dose: negative and Other (1% lidocaine)  Assessment Events: blood not aspirated, no cerebrospinal fluid, injection not painful, no injection resistance, no paresthesia and negative IV test  Additional Notes Patient identified. Risks, benefits, and alternatives discussed with patient including but not limited to bleeding, infection, nerve damage, paralysis, failed block, incomplete pain control, headache, blood pressure changes, nausea, vomiting, reactions to medication, itching, and postpartum back pain. Confirmed with bedside nurse the patient's most recent platelet count. Confirmed with patient that they are not currently taking any anticoagulation, have any bleeding history, or any family history of bleeding disorders. Patient expressed understanding and wished to proceed. All questions were answered. Sterile technique was used throughout the entire procedure. Please see nursing notes for vital signs.   Crisp LOR on first pass at L3-4, catheter threaded easily but would not flush despite replacement at same level with different catheter. Crisp LOR at L2-3 with catheter flushing easily at this level. Test dose was given through epidural  catheter and negative prior to continuing to dose epidural or start infusion. Warning signs of high block given to the patient including shortness of breath, tingling/numbness in hands, complete motor block, or any concerning symptoms with instructions to call for help. Patient was given instructions on fall risk and not to get out of bed. All questions and concerns addressed with instructions to call with any issues or inadequate analgesia.  Reason for block:procedure for pain

## 2023-07-11 NOTE — Progress Notes (Signed)
Labor Progress Note Tara [name] is a 27 y.o. G2P0101 at [redacted]w[redacted]d presented for eIOL.   S: doing well. Had just gotten up at the time of our arrival  O:  BP 129/65   Pulse 79   Temp 97.9 F (36.6 C) (Oral)   Resp 18   Ht 4\' 10"  (1.473 m)   Wt 71.8 kg   SpO2 98%   BMI 33.06 kg/m  EFM: 145 bpm, moderate variability, + accelerations, no decelerations Contractions every 2 - 3 mins per toco.   CVE: Dilation: 6.5 Effacement (%): 80 Station: -1, -2 Presentation: Vertex Exam by:: Viona Gilmore RNC   [name]: 27 y.o. [id number] [redacted]w[redacted]d here for eIOL.  #Labor: S/p dual cytotec  and AROM, no change in the last 3 hrs, will go ahead and start pitocin 2 X2. Patient consenting. #Pain: epidural in place and comfortable #FWB: Cat I  #GBS negative   Sheppard Evens MD MPH OB Fellow, Faculty Practice [facility], Center for [facility] 07/11/2023

## 2023-07-11 NOTE — Discharge Summary (Signed)
Postpartum Discharge Summary  Patient Name: Tara [name] DOB: March 13, 1996 MRN: [id number]  Date of admission: 07/10/2023 Delivery date:07/11/2023 Delivering provider: Wyn Forster Date of discharge: 07/13/2023  Admitting diagnosis: Encounter for elective induction of labor [Z34.90] Intrauterine pregnancy: [redacted]w[redacted]d     Secondary diagnosis:  Principal Problem:   Vaginal delivery Active Problems:   Encounter for supervision of normal pregnancy, antepartum   Hx of preeclampsia, prior pregnancy, currently pregnant   Request for sterilization   Gestational hypertension   Status post tubal ligation  Additional problems: none    Discharge diagnosis: Term Pregnancy Delivered, Gestational Hypertension,  Post partum procedures: none Augmentation: AROM, Pitocin, and Cytotec Complications: None. EBL 202  Hospital course: Induction of Labor With Vaginal Delivery   27 y.o. yo [id number] at [redacted]w[redacted]d was admitted to the hospital 07/10/2023 for induction of labor.  Indication for induction: Elective.  Patient had an labor course uncomplicated.  Membrane Rupture Time/Date: 3:13 AM,07/11/2023  Delivery Method:Vaginal, Spontaneous Operative Delivery:N/A Episiotomy: None Lacerations:  None Details of delivery can be found in separate delivery note.  Patient had a postpartum course complicated by gestational hypertension. She was started on procardia and lasix. She also had moderate anemia of blood loss and received IV venofer 500mg . Patient is discharged home 07/13/23.  Newborn Data: Birth date:07/11/2023 Birth time:2:41 PM Gender:Female Living status:Living Apgars:8 ,9  Weight:3110 g  Magnesium Sulfate received: No BMZ received: No Rhophylac:N/A MMR:N/A T-DaP:Given prenatally Flu: N/A Transfusion:No  Physical exam  Vitals:   07/13/23 1401 07/13/23 1437 07/13/23 1505 07/13/23 1603  BP: 122/75 129/70 130/60 124/71  Pulse: 72 76 72   Resp:  18    Temp:  97.8 F (36.6 C)    TempSrc:  Oral     SpO2:  99%    Weight:      Height:       Physical exam:  See exam findings in progress note dated 07/13/2023 by Shawna Clamp, CNM  Labs: Lab Results  Component Value Date   WBC 9.9 07/13/2023   HGB 9.3 (L) 07/13/2023   HCT 28.5 (L) 07/13/2023   MCV 88.5 07/13/2023   PLT 160 07/13/2023      Latest Ref Rng & Units 07/13/2023    7:19 AM  CMP  Glucose 70 - 99 mg/dL 83   BUN 6 - 20 mg/dL 6   Creatinine 1.30 - 8.65 mg/dL 7.84   Sodium 696 - 295 mmol/L 138   Potassium 3.5 - 5.1 mmol/L 4.2   Chloride 98 - 111 mmol/L 105   CO2 22 - 32 mmol/L 24   Calcium 8.9 - 10.3 mg/dL 8.3   Total Protein 6.5 - 8.1 g/dL 5.4   Total Bilirubin 0.3 - 1.2 mg/dL 0.5   Alkaline Phos 38 - 126 U/L 167   AST 15 - 41 U/L 22   ALT 0 - 44 U/L 13    Edinburgh Score:    07/12/2023    5:56 AM  Edinburgh Postnatal Depression Scale Screening Tool  I have been able to laugh and see the funny side of things. 0  I have looked forward with enjoyment to things. 0  I have blamed myself unnecessarily when things went wrong. 1  I have been anxious or worried for no good reason. 1  I have felt scared or panicky for no good reason. 0  Things have been getting on top of me. 1  I have been so unhappy that I have had difficulty sleeping. 0  I have  felt sad or miserable. 1  I have been so unhappy that I have been crying. 0  The thought of harming myself has occurred to me. 0  Edinburgh Postnatal Depression Scale Total 4     After visit meds:  Allergies as of 07/13/2023   No Known Allergies      Medication List     STOP taking these medications    aspirin EC 81 MG tablet   bismuth subsalicylate 262 MG chewable tablet Commonly known as: PEPTO BISMOL   diphenhydramine-acetaminophen 25-500 MG Tabs tablet Commonly known as: TYLENOL PM   TUMS PO       TAKE these medications    acetaminophen 325 MG tablet Commonly known as: TYLENOL Take 2 tablets (650 mg total) by mouth every 6 (six) hours as needed  for moderate pain, fever or headache. What changed: when to take this   furosemide 20 MG tablet Commonly known as: LASIX Take 1 tablet (20 mg total) by mouth daily. Start taking on: July 14, 2023   ibuprofen 600 MG tablet Commonly known as: ADVIL Take 1 tablet (600 mg total) by mouth every 8 (eight) hours as needed for moderate pain. Take with meals   NIFEdipine 30 MG 24 hr tablet Commonly known as: ADALAT CC Take 1 tablet (30 mg total) by mouth daily. Start taking on: July 14, 2023   PRENATAL PO Take 1 tablet by mouth at bedtime.         Discharge home in stable condition Infant Feeding: Breast Infant Disposition:home with mother Discharge instruction: per After Visit Summary and Postpartum booklet. Activity: Advance as tolerated. Pelvic rest for 6 weeks.  Diet: routine diet Future Appointments: Future Appointments  Date Time Provider Department Center  07/20/2023  3:10 PM Sue Lush, FNP [facility]  08/22/2023  8:50 AM Cresenzo-Dishmon, Scarlette Calico, CNM [facility]   Follow up Visit: as above.  Message sent to FT. Please schedule this patient for a In person postpartum visit in 6 weeks with the following provider: Any provider. Additional Postpartum F/U:Postpartum Depression checkup  Low risk pregnancy complicated by:  uncomplicated Delivery mode:  Vaginal, Spontaneous Anticipated Birth Control:  Plan postpartum BTL  Sheppard Evens MD MPH OB Fellow, Faculty Practice [facility], Center for [facility] 07/13/2023

## 2023-07-11 NOTE — Progress Notes (Signed)
Labor Progress Note Tara [name] is a 27 y.o. G2P0101 at [redacted]w[redacted]d presented for eIOL.    S: doing well with epidural   O:  BP 129/65   Pulse 79   Temp 97.9 F (36.6 C) (Oral)   Resp 18   Ht 4\' 10"  (1.473 m)   Wt 71.8 kg   SpO2 98%   BMI 33.06 kg/m  EFM: 155 bpm, moderate variability, + accelerations, no decelerations Contractions every 2 - 3 mins per toco.     CVE: Dilation: 9.5 Effacement (%): 100 Station: +1 Presentation: Vertex Exam by: Wyn Forster MD      [name]: 27 y.o. G2P0101 [redacted]w[redacted]d here for eIOL.  #Labor: S/p dual cytotec  and AROM, no change in the last 3 hrs, will go ahead and start pitocin 2 X2. Patient consenting. #Pain: epidural in place and comfortable #FWB: Cat I  #GBS negative     Wyn Forster MD  OB Fellow, Faculty Practice [facility], Center for [facility] 07/11/2023

## 2023-07-12 ENCOUNTER — Encounter (HOSPITAL_COMMUNITY)
Admission: RE | Disposition: A | Discharge status: Home / Self Care | Payer: Self-pay | Source: Home / Self Care | Attending: Obstetrics & Gynecology

## 2023-07-12 ENCOUNTER — Encounter (HOSPITAL_COMMUNITY): Payer: Self-pay | Admitting: Obstetrics and Gynecology

## 2023-07-12 ENCOUNTER — Inpatient Hospital Stay (HOSPITAL_COMMUNITY): Payer: Medicaid Other

## 2023-07-12 ENCOUNTER — Other Ambulatory Visit: Payer: Self-pay

## 2023-07-12 DIAGNOSIS — Z302 Encounter for sterilization: Secondary | ICD-10-CM

## 2023-07-12 HISTORY — PX: TUBAL LIGATION: SHX77

## 2023-07-12 SURGERY — LIGATION, FALLOPIAN TUBE, POSTPARTUM
Anesthesia: Epidural | Site: Abdomen

## 2023-07-12 MED ORDER — LIDOCAINE-EPINEPHRINE (PF) 2 %-1:200000 IJ SOLN
INTRAMUSCULAR | Status: AC
  Filled 2023-07-12: qty 20

## 2023-07-12 MED ORDER — KETOROLAC TROMETHAMINE 30 MG/ML IJ SOLN
INTRAMUSCULAR | Status: DC | PRN
  Administered 2023-07-12: 30 mg via INTRAVENOUS

## 2023-07-12 MED ORDER — MIDAZOLAM HCL 2 MG/2ML IJ SOLN
INTRAMUSCULAR | Status: AC
  Filled 2023-07-12: qty 2

## 2023-07-12 MED ORDER — SODIUM CHLORIDE 0.9 % IR SOLN
Status: DC | PRN
  Administered 2023-07-12: 1000 mL

## 2023-07-12 MED ORDER — BUPIVACAINE HCL (PF) 0.25 % IJ SOLN
INTRAMUSCULAR | Status: AC
  Filled 2023-07-12: qty 10

## 2023-07-12 MED ORDER — FENTANYL CITRATE (PF) 100 MCG/2ML IJ SOLN
INTRAMUSCULAR | Status: DC | PRN
Start: 2023-07-12 — End: 2023-07-12
  Administered 2023-07-12: 50 ug via INTRAVENOUS

## 2023-07-12 MED ORDER — BUPIVACAINE HCL 0.25 % IJ SOLN
INTRAMUSCULAR | Status: DC | PRN
  Administered 2023-07-12: 10 mL

## 2023-07-12 MED ORDER — FENTANYL CITRATE (PF) 100 MCG/2ML IJ SOLN
INTRAMUSCULAR | Status: DC | PRN
  Administered 2023-07-12: 100 ug via EPIDURAL

## 2023-07-12 MED ORDER — FENTANYL CITRATE (PF) 100 MCG/2ML IJ SOLN
INTRAMUSCULAR | Status: AC
  Filled 2023-07-12: qty 2

## 2023-07-12 MED ORDER — MEPERIDINE HCL 25 MG/ML IJ SOLN: 6.2500 mg | INTRAMUSCULAR | Status: DC | PRN

## 2023-07-12 MED ORDER — DEXMEDETOMIDINE HCL IN NACL 80 MCG/20ML IV SOLN
INTRAVENOUS | Status: DC | PRN
  Administered 2023-07-12: 12 ug via INTRAVENOUS
  Administered 2023-07-12: 8 ug via INTRAVENOUS

## 2023-07-12 MED ORDER — AMISULPRIDE (ANTIEMETIC) 5 MG/2ML IV SOLN: 10.0000 mg | Freq: Once | INTRAVENOUS | Status: DC | PRN

## 2023-07-12 MED ORDER — ACETAMINOPHEN 10 MG/ML IV SOLN
INTRAVENOUS | Status: DC | PRN
  Administered 2023-07-12: 1000 mg via INTRAVENOUS

## 2023-07-12 MED ORDER — IRON SUCROSE 500 MG IVPB - SIMPLE MED
500.0000 mg | Freq: Once | INTRAVENOUS | Status: AC
  Administered 2023-07-12: 500 mg via INTRAVENOUS
  Filled 2023-07-12: qty 275

## 2023-07-12 MED ORDER — STERILE WATER FOR IRRIGATION IR SOLN
Status: DC | PRN
  Administered 2023-07-12: 1000 mL

## 2023-07-12 MED ORDER — LIDOCAINE-EPINEPHRINE (PF) 2 %-1:200000 IJ SOLN
INTRAMUSCULAR | Status: DC | PRN
  Administered 2023-07-12: 10 mL via EPIDURAL
  Administered 2023-07-12 (×2): 5 mL via EPIDURAL

## 2023-07-12 MED ORDER — ACETAMINOPHEN 10 MG/ML IV SOLN
INTRAVENOUS | Status: AC
  Filled 2023-07-12: qty 100

## 2023-07-12 MED ORDER — ONDANSETRON HCL 4 MG/2ML IJ SOLN: 4.0000 mg | Freq: Once | INTRAMUSCULAR | Status: DC | PRN

## 2023-07-12 MED ORDER — DEXMEDETOMIDINE HCL IN NACL 80 MCG/20ML IV SOLN
INTRAVENOUS | Status: AC
  Filled 2023-07-12: qty 20

## 2023-07-12 MED ORDER — KETOROLAC TROMETHAMINE 30 MG/ML IJ SOLN
INTRAMUSCULAR | Status: AC
  Filled 2023-07-12: qty 1

## 2023-07-12 MED ORDER — OXYCODONE HCL 5 MG PO TABS: 5.0000 mg | ORAL_TABLET | Freq: Once | ORAL | Status: DC | PRN

## 2023-07-12 MED ORDER — OXYCODONE HCL 5 MG/5ML PO SOLN: 5.0000 mg | Freq: Once | ORAL | Status: DC | PRN

## 2023-07-12 MED ORDER — MIDAZOLAM HCL 5 MG/5ML IJ SOLN
INTRAMUSCULAR | Status: DC | PRN
  Administered 2023-07-12 (×2): 1 mg via INTRAVENOUS

## 2023-07-12 MED ORDER — LACTATED RINGERS IV SOLN: INTRAVENOUS | Status: DC | PRN

## 2023-07-12 MED ORDER — HYDROMORPHONE HCL 1 MG/ML IJ SOLN: 0.2500 mg | INTRAMUSCULAR | Status: DC | PRN

## 2023-07-12 MED ORDER — KETOROLAC TROMETHAMINE 30 MG/ML IJ SOLN: 30.0000 mg | Freq: Once | INTRAMUSCULAR | Status: DC | PRN

## 2023-07-12 MED ORDER — ONDANSETRON HCL 4 MG/2ML IJ SOLN
INTRAMUSCULAR | Status: AC
  Filled 2023-07-12: qty 2

## 2023-07-12 SURGICAL SUPPLY — 19 items
CLOTH BEACON ORANGE TIMEOUT ST (SAFETY) ×1 IMPLANT
DRSG OPSITE POSTOP 3X4 (GAUZE/BANDAGES/DRESSINGS) ×1 IMPLANT
DURAPREP 26ML APPLICATOR (WOUND CARE) ×1 IMPLANT
ELECT REM PT RETURN 9FT ADLT (ELECTROSURGICAL) ×1
ELECTRODE REM PT RTRN 9FT ADLT (ELECTROSURGICAL) ×1 IMPLANT
GLOVE SURG ORTHO 8.0 STRL STRW (GLOVE) ×1 IMPLANT
GOWN STRL REUS W/TWL LRG LVL3 (GOWN DISPOSABLE) ×2 IMPLANT
LIGASURE IMPACT 36 18CM CVD LR (INSTRUMENTS) IMPLANT
NS IRRIG 1000ML POUR BTL (IV SOLUTION) ×1 IMPLANT
PACK ABDOMINAL MINOR (CUSTOM PROCEDURE TRAY) ×1 IMPLANT
PENCIL SMOKE EVAC W/HOLSTER (ELECTROSURGICAL) ×1 IMPLANT
PENCIL SMOKE EVACUATOR (MISCELLANEOUS) IMPLANT
PROTECTOR NERVE ULNAR (MISCELLANEOUS) ×1 IMPLANT
SPONGE LAP 4X18 RFD (DISPOSABLE) ×1 IMPLANT
SUT MNCRL AB 3-0 PS2 27 (SUTURE) ×1 IMPLANT
SUT PLAIN 0 NONE (SUTURE) ×1 IMPLANT
SUT VICRYL 0 UR6 27IN ABS (SUTURE) ×1 IMPLANT
TOWEL OR 17X24 6PK STRL BLUE (TOWEL DISPOSABLE) ×2 IMPLANT
TRAY FOLEY W/BAG SLVR 14FR LF (SET/KITS/TRAYS/PACK) ×1 IMPLANT

## 2023-07-12 NOTE — Op Note (Signed)
Postpartum Tubal Ligation Operative Note   Patient: Tara [name]  Date of Procedure: 07/12/2023  Procedure: Postpartum bilateral Tubal Ligation via Bilateral salpingectomy   Indications: undesired fertility  Pre-operative Diagnosis: Post partum tubal ligation.   Post-operative Diagnosis: Bilateral Tubal Sterilization via Bilateral salpingectomy using ligasure  Surgeon: Surgeons and Role:    * Warden Fillers, MD - Primary    * Roneka Gilpin, Nadene Rubins, MD - Fellow  Assistants: Alfredia Ferguson, MD  An experienced assistant was required given the standard of surgical care given the complexity of the case.  This assistant was needed for exposure, dissection, suctioning, retraction, instrument exchange, assisting with delivery with administration of fundal pressure, and for overall help during the procedure.   Anesthesia: epidural  Anesthesiologist: Dr Salvadore Farber   Antibiotics: None   Estimated Blood Loss: 5 ml   Total IV Fluids: 400 ml  Urine Output:  450 cc OF clear urine  Specimens: segment of  bilateral fallopian tubes   Complications: no complications   Indications: Tara [name] is a 27 y.o. B1Y7829 with undesired fertility, status post vaginal delivery, desires permanent sterilization.  Other reversible forms of contraception were discussed with patient; she declines all other modalities. Risks of procedure discussed with patient including but not limited to: risk of regret, permanence of method, bleeding, infection, injury to surrounding organs and need for additional procedures.  Failure risk of 1-2 % with increased risk of ectopic gestation if pregnancy occurs and possibility of post-tubal pain syndrome also discussed with patient.  Findings: Normal uterus, Normal bilateral fallopian tubes, Normal bilateral ovaries.  Procedure Details: The patient was taken to the operating room where epidural anesthesia was dosed and found to be adequate.  She was then placed in  the dorsal supine position and prepped and draped in sterile fashion.  After an adequate timeout was performed, attention was turned to the patient's abdomen where a small transverse skin incision was made under the umbilical fold. The incision was taken down to the layer of fascia using the scalpel, and fascia was incised, and the incision was extended bilaterally. The peritoneum was entered in a sharp fashion.   Attention was then turned to the fallopian tubes. The left Fallopian tube was identified and then traced to it's fimbriae. Using the Ligasure device and taking care to avoid large vascular structures, about 2/3rd of the left fallopian tube was removed sequentially, with excellent hemostasis noted. Attention was then turned to the right fallopian tube, and after confirmation of identification by tracing the tube out to the fimbriae, the same procedure was then performed on with excellent hemostasis noted.   Good hemostasis was noted overall. All laps and instruments were then removed from the patient's abdomen and the fascial incision was repaired with 0 Vicryl. The subcutaneous tissue was not reapproximated. The skin was closed with a 4-0 Monocryl subcuticular stitch. The patient tolerated the procedure well.  Instrument, sponge, and needle counts were correct times three.  The patient was then taken to the recovery room awake and in stable condition.  Disposition: PACU - hemodynamically stable.    Signed: Sheppard Evens MD MPH OB Fellow, Faculty Practice [facility], Center for [facility] 07/12/2023

## 2023-07-12 NOTE — Anesthesia Postprocedure Evaluation (Signed)
Anesthesia Post Note  Patient: Tara [name]  Procedure(s) Performed: POST PARTUM TUBAL LIGATION     Patient location during evaluation: Mother Baby Anesthesia Type: Epidural Level of consciousness: awake and alert Pain management: pain level controlled Vital Signs Assessment: post-procedure vital signs reviewed and stable Respiratory status: spontaneous breathing, nonlabored ventilation and respiratory function stable Cardiovascular status: stable Postop Assessment: no headache, no backache and epidural receding Anesthetic complications: no   No notable events documented.  Last Vitals:  Vitals:   07/12/23 0235 07/12/23 0556  BP: 122/74 122/76  Pulse: 72 79  Resp: 18 18  Temp: 36.7 C 36.6 C  SpO2: 100% 99%    Last Pain:  Vitals:   07/12/23 0556  TempSrc: Oral  PainSc: 4    Pain Goal:                   Tara [name]

## 2023-07-12 NOTE — Plan of Care (Signed)
Progressing

## 2023-07-12 NOTE — Lactation Note (Signed)
This note was copied from a baby's chart. Lactation Consultation Note  Patient Name: Tara [name] [id number] Date: 07/12/2023 Age:27 hours Reason for consult: Initial assessment;Term;Infant weight loss (- 1.93 % WL)  Visited with family of 38 hours old FT female; Tara [name] is a P2 but not experienced breastfeeding, she mostly pumped and bottle fed with her first baby, but voiced that breastfeeding is going well with this one; she's been latching at every feeding. Assisted with hand expression prior latching, this LC took baby to both breast in football hold, she latched with ease but kept breaking the seal due to hiccups (see LATCH score). Baby still nursing when exiting the room. Tara [name] started pumping yesterday and supplementing with her EBM, praised her for her efforts, Tara [name] voiced baby didn't like formula. Reviewed normal newborn behavior, feeding cues, cluster feeding, size of baby's stomach, lactogenesis II and anticipatory guidelines.   Maternal Data Has patient been taught Hand Expression?: Yes Does the patient have breastfeeding experience prior to this delivery?: Yes How long did the patient breastfeed?: 6 months  Feeding Mother's Current Feeding Choice: Breast Milk and Formula  LATCH Score Latch: Repeated attempts needed to sustain latch, nipple held in mouth throughout feeding, stimulation needed to elicit sucking reflex.  Audible Swallowing: A few with stimulation  Type of Nipple: Everted at rest and after stimulation  Comfort (Breast/Nipple): Soft / non-tender  Hold (Positioning): Assistance needed to correctly position infant at breast and maintain latch.  LATCH Score: 7  Interventions Interventions: Breast feeding basics reviewed;Breast massage;Hand express;Education;LC Services brochure;Assisted with latch;Skin to skin;Breast compression;Adjust position;Support pillows;Position options;DEBP  Plan Encouraged to continue putting baby to breast +8 times/24  hours or sooner if feeding cues are present Breast massage, hand expression were also encouraged prior latching She'll continue pumping after feedings/attempts at the breast  Parents will continue supplementing with EBM/formula per feeding choice on admission  Tara [name] and visitors present. All questions and concerns answered, family to contact [facility] services PRN.  Discharge Pump: Personal (Evenflo DEBP)  Consult Status Consult Status: Follow-up Date: 07/13/23 Follow-up type: In-patient   Tara [name] Tara [name] 07/12/2023, 6:28 PM

## 2023-07-12 NOTE — Anesthesia Preprocedure Evaluation (Signed)
Anesthesia Evaluation  Patient identified by MRN, date of birth, ID band Patient awake    Reviewed: Allergy & Precautions, Patient's Chart, lab work & pertinent test results  Airway Mallampati: II  TM Distance: >3 FB Neck ROM: Full    Dental no notable dental hx.    Pulmonary Current Smoker   Pulmonary exam normal breath sounds clear to auscultation       Cardiovascular hypertension, Normal cardiovascular exam Rhythm:Regular Rate:Normal     Neuro/Psych negative neurological ROS  negative psych ROS   GI/Hepatic negative GI ROS, Neg liver ROS,,,  Endo/Other  negative endocrine ROS    Renal/GU negative Renal ROS  negative genitourinary   Musculoskeletal negative musculoskeletal ROS (+)    Abdominal   Peds negative pediatric ROS (+)  Hematology  (+) Blood dyscrasia, anemia Hb 8.8, plt 160   Anesthesia Other Findings   Reproductive/Obstetrics (+) Pregnancy                             Anesthesia Physical Anesthesia Plan  ASA: 2  Anesthesia Plan: Epidural   Post-op Pain Management: Ofirmev IV (intra-op)* and Toradol IV (intra-op)*   Induction:   PONV Risk Score and Plan: 2 and Treatment may vary due to age or medical condition, Ondansetron and Dexamethasone  Airway Management Planned: Natural Airway  Additional Equipment: None  Intra-op Plan:   Post-operative Plan:   Informed Consent: I have reviewed the patients History and Physical, chart, labs and discussed the procedure including the risks, benefits and alternatives for the proposed anesthesia with the patient or authorized representative who has indicated his/her understanding and acceptance.       Plan Discussed with: CRNA  Anesthesia Plan Comments:        Anesthesia Quick Evaluation

## 2023-07-12 NOTE — Transfer of Care (Signed)
Immediate Anesthesia Transfer of Care Note  Patient: Tara [name]  Procedure(s) Performed: POST PARTUM TUBAL LIGATION (Abdomen)  Patient Location: PACU  Anesthesia Type:Epidural  Level of Consciousness: awake  Airway & Oxygen Therapy: Patient Spontanous Breathing  Post-op Assessment: Report given to RN and Post -op Vital signs reviewed and stable  Post vital signs: Reviewed and stable  Last Vitals:  Vitals Value Taken Time  BP    Temp    Pulse    Resp    SpO2      Last Pain:  Vitals:   07/12/23 0815  TempSrc:   PainSc: 0-No pain         Complications: No notable events documented.

## 2023-07-12 NOTE — Anesthesia Postprocedure Evaluation (Signed)
Anesthesia Post Note  Patient: Tara [name]  Procedure(s) Performed: POST PARTUM TUBAL LIGATION (Abdomen)     Patient location during evaluation: PACU Anesthesia Type: Epidural Level of consciousness: awake and alert and oriented Pain management: pain level controlled Vital Signs Assessment: post-procedure vital signs reviewed and stable Respiratory status: spontaneous breathing, nonlabored ventilation and respiratory function stable Cardiovascular status: blood pressure returned to baseline and stable Postop Assessment: no headache, no backache, spinal receding and patient able to bend at knees Anesthetic complications: no   No notable events documented.  Last Vitals:  Vitals:   07/12/23 1200 07/12/23 1220  BP: 114/62 124/64  Pulse: 89 76  Resp: 14 15  Temp:  36.7 C  SpO2: 93% 98%    Last Pain:  Vitals:   07/12/23 1220  TempSrc: Oral  PainSc:    Pain Goal: Patients Stated Pain Goal: 5 (07/12/23 1116)  LLE Motor Response: Purposeful movement (07/12/23 1200) LLE Sensation: Tingling (07/12/23 1200) RLE Motor Response: Purposeful movement (07/12/23 1200) RLE Sensation: Tingling (07/12/23 1200) L Sensory Level: L5-Outer lower leg, top of foot, great toe (07/12/23 1200) R Sensory Level: L5-Outer lower leg, top of foot, great toe (07/12/23 1200) Epidural/Spinal Function Cutaneous sensation: Normal sensation (07/12/23 1145), Patient able to flex knees: Yes (07/12/23 1145), Patient able to lift hips off bed: Yes (07/12/23 1145), Back pain beyond tenderness at insertion site: No (07/12/23 1145), Progressively worsening motor and/or sensory loss: No (07/12/23 1145), Bowel and/or bladder incontinence post epidural: No (07/12/23 1145)  Lannie Fields

## 2023-07-12 NOTE — Progress Notes (Addendum)
POSTPARTUM PROGRESS NOTE  Post Partum Day 1  Subjective:  Tara [name] is a 27 y.o. [id number] s/p SVD at [redacted]w[redacted]d.  She reports she is doing well. No acute events overnight. She denies any problems with ambulating, voiding or po intake. Denies nausea or vomiting.  Pain is well controlled.  Lochia is stable and decreasing.  Objective: Blood pressure 122/76, pulse 79, temperature 97.8 F (36.6 C), temperature source Oral, resp. rate 18, height 4\' 10"  (1.473 m), weight 71.8 kg, SpO2 99%, unknown if currently breastfeeding.  Physical Exam:  General: alert, cooperative and no distress Chest: no respiratory distress Heart:regular rate, distal pulses intact Uterine Fundus: firm, appropriately tender DVT Evaluation: No calf swelling or tenderness Extremities: No edema Skin: warm, dry  Recent Labs    07/10/23 2113 07/12/23 0701  HGB 11.4* 8.8*  HCT 34.1* 27.2*    Assessment/Plan: Tara [name] is a 27 y.o. [id number] s/p SVD at [redacted]w[redacted]d   PPD#1 - Doing well  Routine postpartum care  History of Pre-E: Continue to monitor blood pressures post partum. Contraception: BTL scheduled on 07/12/23 at 0945 Feeding: Breast Dispo: Plan for discharge 07/13/23.   LOS: 2 days   Olga Millers, DO PGY-3 Family Medicine 07/12/2023, 9:09 AM      Attestation of Attending Supervision of Resident: Evaluation and management procedures were performed by the [facility] Medicine Resident under my supervision. I was immediately available for direct supervision, assistance and direction throughout this encounter.  I also confirm that I have verified the information documented in the resident's note, and that I have also personally reperformed the pertinent components of the physical exam and all of the medical decision making activities.  I have also made any necessary editorial changes.    Pt was seen.  Agree with above assessment.   Patient counseled regarding bilateral tubal ligation. Reviewed that this is a  permanent procedure and that she will not be able to have children after it is done. Reviewed risks of bilateral tubal ligation including infection, hemorrhage, damage to surrounding tissue and organs, risk of regret. Reviewed that bilateral tubal ligation is not 100% effective and she should take a pregnancy test if she believes for any reason she may be pregnant. Reviewed slightly increased risk of ectopic pregnancy and need to seek care if she becomes pregnant. She understands this is an elective procedure and again affirms her desire. Consent signed, also consents to blood transfusion if necessary.    Hgb 8.8 noted, will discuss inpatient iron infusion post procedure to have onboard before discharge.  Warden Fillers, MD Attending Obstetrician & Gynecologist, [facility] for [facility], [facility] 07/12/2023 9:26 AM

## 2023-07-13 DIAGNOSIS — Z8759 Personal history of other complications of pregnancy, childbirth and the puerperium: Secondary | ICD-10-CM | POA: Insufficient documentation

## 2023-07-13 DIAGNOSIS — Z9851 Tubal ligation status: Secondary | ICD-10-CM

## 2023-07-13 DIAGNOSIS — O139 Gestational [pregnancy-induced] hypertension without significant proteinuria, unspecified trimester: Secondary | ICD-10-CM | POA: Insufficient documentation

## 2023-07-13 LAB — CBC
HCT: 28.5 % — ABNORMAL LOW (ref 36.0–46.0)
Hemoglobin: 9.3 g/dL — ABNORMAL LOW (ref 12.0–15.0)
MCH: 28.9 pg (ref 26.0–34.0)
MCHC: 32.6 g/dL (ref 30.0–36.0)
MCV: 88.5 fL (ref 80.0–100.0)
Platelets: 160 10*3/uL (ref 150–400)
RBC: 3.22 MIL/uL — ABNORMAL LOW (ref 3.87–5.11)
RDW: 13.8 % (ref 11.5–15.5)
WBC: 9.9 10*3/uL (ref 4.0–10.5)
nRBC: 0 % (ref 0.0–0.2)

## 2023-07-13 LAB — PROTEIN / CREATININE RATIO, URINE
Creatinine, Urine: 86 mg/dL
Protein Creatinine Ratio: 0.09 mg/mg{Cre} (ref 0.00–0.15)
Total Protein, Urine: 8 mg/dL

## 2023-07-13 LAB — COMPREHENSIVE METABOLIC PANEL
ALT: 13 U/L (ref 0–44)
AST: 22 U/L (ref 15–41)
Albumin: 2.4 g/dL — ABNORMAL LOW (ref 3.5–5.0)
Alkaline Phosphatase: 167 U/L — ABNORMAL HIGH (ref 38–126)
Anion gap: 9 (ref 5–15)
BUN: 6 mg/dL (ref 6–20)
CO2: 24 mmol/L (ref 22–32)
Calcium: 8.3 mg/dL — ABNORMAL LOW (ref 8.9–10.3)
Chloride: 105 mmol/L (ref 98–111)
Creatinine, Ser: 0.71 mg/dL (ref 0.44–1.00)
GFR, Estimated: >60 mL/min (ref >60)
Glucose, Bld: 83 mg/dL (ref 70–99)
Potassium: 4.2 mmol/L (ref 3.5–5.1)
Sodium: 138 mmol/L (ref 135–145)
Total Bilirubin: 0.5 mg/dL (ref 0.3–1.2)
Total Protein: 5.4 g/dL — ABNORMAL LOW (ref 6.5–8.1)

## 2023-07-13 MED ORDER — NIFEDIPINE ER OSMOTIC RELEASE 30 MG PO TB24
30.0000 mg | ORAL_TABLET | Freq: Every day | ORAL | Status: DC
  Administered 2023-07-13: 30 mg via ORAL
  Filled 2023-07-13: qty 1

## 2023-07-13 MED ORDER — FUROSEMIDE 20 MG PO TABS: 20.0000 mg | ORAL_TABLET | Freq: Every day | ORAL | 0 refills | Status: DC

## 2023-07-13 MED ORDER — CYCLOBENZAPRINE HCL 10 MG PO TABS
10.0000 mg | ORAL_TABLET | ORAL | Status: AC
  Administered 2023-07-13: 10 mg via ORAL
  Filled 2023-07-13: qty 1

## 2023-07-13 MED ORDER — BUTALBITAL-APAP-CAFFEINE 50-325-40 MG PO TABS
1.0000 | ORAL_TABLET | ORAL | Status: AC
  Administered 2023-07-13: 1 via ORAL
  Filled 2023-07-13: qty 1

## 2023-07-13 MED ORDER — FUROSEMIDE 20 MG PO TABS
20.0000 mg | ORAL_TABLET | Freq: Every day | ORAL | Status: DC
  Administered 2023-07-13: 20 mg via ORAL
  Filled 2023-07-13: qty 1

## 2023-07-13 MED ORDER — NIFEDIPINE ER 30 MG PO TB24: 30.0000 mg | ORAL_TABLET | Freq: Every day | ORAL | 0 refills | Status: DC

## 2023-07-13 MED ORDER — IBUPROFEN 600 MG PO TABS: 600.0000 mg | ORAL_TABLET | Freq: Three times a day (TID) | ORAL | 0 refills | Status: DC | PRN

## 2023-07-13 MED ORDER — OXYCODONE HCL 5 MG PO TABS
5.0000 mg | ORAL_TABLET | ORAL | Status: DC | PRN
  Administered 2023-07-13 (×2): 5 mg via ORAL
  Filled 2023-07-13 (×2): qty 1

## 2023-07-13 MED ORDER — ACETAMINOPHEN 325 MG PO TABS: 650.0000 mg | ORAL_TABLET | Freq: Four times a day (QID) | ORAL | 0 refills | Status: DC | PRN

## 2023-07-13 NOTE — Progress Notes (Signed)
Post Partum Day 2 Subjective: Eating, drinking, voiding, ambulating well.  +flatus.  Lochia and pain wnl.   Headache began yesterday, frontal, rates 3/10- improved some w/ ibuprofen but comes right back. Sees stars when closes eyes and opens them back up, while sitting in bed. Some dizziness upon ambulation.   Objective: Blood pressure 136/70, pulse 86, temperature 98.4 F (36.9 C), temperature source Oral, resp. rate 19, height 4\' 10"  (1.473 m), weight 71.8 kg, SpO2 99%, unknown if currently breastfeeding. BP 2231: 150/72 2318: 124/81 0359: 130/90  Physical Exam:  General: alert, cooperative, and no distress Lochia: appropriate Uterine Fundus: firm Incision: umbilical incision from BTL- healing well, no significant drainage, no dehiscence, no significant erythema DVT Evaluation: No evidence of DVT seen on physical exam. Negative Homan's sign. No cords or calf tenderness. No significant calf/ankle edema.  Recent Labs    07/10/23 2113 07/12/23 0701  HGB 11.4* 8.8*  HCT 34.1* 27.2*    Assessment/Plan: S/P IV Venofer yesterday for anemia Add lasix 20mg  daily, nifedipine 30mg  daily Discussed sx w/ Dr. Jolayne Panther, will get pre-e labs, bp's q 4hr If anymore elevated bp's will go to [facility] for mag   LOS: 3 days   Cheral Marker, CNM 07/13/2023, 7:07 AM

## 2023-07-13 NOTE — Lactation Note (Signed)
This note was copied from a baby's chart. Lactation Consultation Note Mom stated baby is cluster feeding. Mom stated the baby isn't opening her mouth very wide and is just getting on the tip of the nipple and her nipples are sore. Mom stated she had pumped earlier and collected colostrum. Suggested mom attempt to give the baby the colostrum since she is cluster feeding.  LC assisted in getting the baby set up for latching. Discussed body alignment, positioning, support, props during feeding and important to have cheeks to breast. Instructed how to get baby to open wide and then latch. Mom stated painful at first, demonstrated flanging lips. Mom stated felt better. LC heard audible swallows. Baby was sleeping and not attempted to get her latched baby wasn't interested in BF any long. Very relaxed. LC swaddled and burped baby placed her in bassinet. Of course when you lay baby down they act like they want to feed. Suggested to mom if baby kept cueing would give her BM that she pumped to give her nipples relief. Suggested mom supplement w/her BM after BF d/t baby hasn't voided in 24 hrs. Mom stated OK. Talked w/RN about the need for baby to void and supplement.  Patient Name: Tara [name] [id number] Date: 07/13/2023 Age:27 hours Reason for consult: Follow-up assessment;Term   Maternal Data    Feeding    LATCH Score Latch: Grasps breast easily, tongue down, lips flanged, rhythmical sucking.  Audible Swallowing: Spontaneous and intermittent  Type of Nipple: Everted at rest and after stimulation  Comfort (Breast/Nipple): Filling, red/small blisters or bruises, mild/mod discomfort (nipples very sore/intact)  Hold (Positioning): Assistance needed to correctly position infant at breast and maintain latch.  LATCH Score: 8   Lactation Tools Discussed/Used    Interventions Interventions: Breast feeding basics reviewed;Adjust position;Assisted with latch;Support pillows;Skin to  skin;Position options;Education;Breast massage;Breast compression  Discharge    Consult Status Consult Status: Follow-up Date: 07/13/23 Follow-up type: In-patient    Brinkley Peet, Diamond Nickel 07/13/2023, 4:07 AM

## 2023-07-13 NOTE — Discharge Instructions (Signed)
-   Continue your prenatal vitamins especially if breastfeeding - Try to eat iron rich food. - Take over the counter tylenol (500mg) or ibuprofen (200mg) three times a day as needed for cramping/pain. - continue blood pressure medication. - Take water pill (lasix) as prescribed for a total of 5 days. - follow up in clinic in 1 week for a blood pressure check and in 4-6 weeks as scheduled for your regular post partum visit. - Please come back to [facility] if you notice persistently elevated blood pressures or you start to have a headache, that doesn't get better with medications (tylenol and ibuprofen), rest (4hrs of sleep) and drinking water.  

## 2023-07-13 NOTE — Lactation Note (Signed)
This note was copied from a baby's chart. Lactation Consultation Note  Patient Name: Tara [name] [id number] Date: 07/13/2023 Age:27 hours Reason for consult: Follow-up assessment;Term  P2, [redacted]w[redacted]d GA, 6.27% weight loss  [name];[name];DEBP  Consult Status Consult Status: Follow-up Date: 07/14/23 Follow-up type: In-patient    Christella Hartigan M 07/13/2023, 9:48 AM

## 2023-07-16 ENCOUNTER — Inpatient Hospital Stay (HOSPITAL_COMMUNITY): Payer: Medicaid Other

## 2023-07-20 ENCOUNTER — Encounter: Payer: Self-pay | Admitting: Obstetrics and Gynecology

## 2023-07-20 ENCOUNTER — Ambulatory Visit (INDEPENDENT_AMBULATORY_CARE_PROVIDER_SITE_OTHER): Payer: Medicaid Other | Admitting: Obstetrics and Gynecology

## 2023-07-20 DIAGNOSIS — Z9851 Tubal ligation status: Secondary | ICD-10-CM | POA: Diagnosis not present

## 2023-07-20 DIAGNOSIS — O139 Gestational [pregnancy-induced] hypertension without significant proteinuria, unspecified trimester: Secondary | ICD-10-CM

## 2023-07-20 MED ORDER — CYCLOBENZAPRINE HCL 10 MG PO TABS
10.0000 mg | ORAL_TABLET | Freq: Three times a day (TID) | ORAL | 1 refills | Status: DC | PRN
Start: 2023-07-20 — End: 2023-09-27

## 2023-07-20 NOTE — Patient Instructions (Signed)
Stop taking procardia 2 days prior to postpartum appointment.

## 2023-07-20 NOTE — Progress Notes (Signed)
   GYN VISIT Patient name: Tara [name] MRN [id number]  Date of birth: 01/27/1996 Chief Complaint:   Postpartum Care  History of Present Illness:   Tara [name] is a 27 y.o. G39P1102  female being seen today for mood check and bp check   The current method of family planning is tubal ligation.  Last pap 01/26/22. Results were: NILM w/ HRHPV negative     04/14/2023    9:24 AM 01/03/2023    3:35 PM 01/26/2022    2:23 PM 01/14/2022    3:17 PM  Depression screen PHQ 2/9  Decreased Interest 1 1 1  0  Down, Depressed, Hopeless 0 0 1 0  PHQ - 2 Score 1 1 2  0  Altered sleeping 2 2 1    Tired, decreased energy 1 1 1    Change in appetite 0 1 1   Feeling bad or failure about yourself  0 0 1   Trouble concentrating 0 0 0   Moving slowly or fidgety/restless 0 0 0   Suicidal thoughts 0 0 0   PHQ-9 Score 4 5 6    Difficult doing work/chores Not difficult at all           04/14/2023    9:26 AM 01/03/2023    3:35 PM 01/26/2022    2:23 PM  GAD 7 : Generalized Anxiety Score  Nervous, Anxious, on Edge 0 1 1  Control/stop worrying 1 1 0  Worry too much - different things 1 1 0  Trouble relaxing 0 0 0  Restless 0 0 0  Easily annoyed or irritable 1 2 1   Afraid - awful might happen 1 0 0  Total GAD 7 Score 4 5 2      Review of Systems:   Pertinent items are noted in HPI Denies fever/chills, dizziness, headaches, visual disturbances, fatigue, shortness of breath, chest pain, abdominal pain, vomiting, abnormal vaginal discharge/itching/odor/irritation, problems with periods, bowel movements, urination, or intercourse unless otherwise stated above.  Pertinent History Reviewed:  Reviewed past medical,surgical, social, obstetrical and family history.  Reviewed problem list, medications and allergies. Physical Assessment:   Vitals:   07/20/23 1525  BP: 122/84  Pulse: 84  Weight: 142 lb (64.4 kg)  Height: 4\' 9"  (1.448 m)  Body mass index is 30.73 kg/m.       Physical Examination:   General  appearance: alert, well appearing, and in no distress  Mental status: alert, oriented to person, place, and time  Skin: warm & dry   Cardiovascular: normal heart rate noted  Respiratory: normal respiratory effort, no distress  Abdomen: soft, non-tender , BTL incision healing well  Pelvic: deferred  Extremities: no edema     No results found for this or any previous visit (from the past 24 hour(s)).  Assessment & Plan:   1. Postpartum exam Reports doing well, with support at home. Encouraged to let us know if anything changes or if she would like to discuss meds or counseling.   2. Status post tubal ligation Healing well  3. Gestational hypertension, antepartum Normotensive today, on procardia 30mg . Discussed stopping meds 2 days prior to PP appt. Instructions given when to follow up of go to hospital for bp or s&s    Future Appointments  Date Time Provider Department Center  08/22/2023  8:50 AM Cresenzo-Dishmon, Scarlette Calico, CNM [facility]    Sharyn Creamer

## 2023-08-22 ENCOUNTER — Ambulatory Visit: Payer: Medicaid Other | Admitting: Women's Health

## 2023-08-22 ENCOUNTER — Encounter: Payer: Self-pay | Admitting: Women's Health

## 2023-08-22 DIAGNOSIS — Z1330 Encounter for screening examination for mental health and behavioral disorders, unspecified: Secondary | ICD-10-CM | POA: Diagnosis not present

## 2023-08-22 DIAGNOSIS — O09299 Supervision of pregnancy with other poor reproductive or obstetric history, unspecified trimester: Secondary | ICD-10-CM

## 2023-08-22 DIAGNOSIS — Z8751 Personal history of pre-term labor: Secondary | ICD-10-CM

## 2023-08-22 NOTE — Progress Notes (Signed)
POSTPARTUM VISIT Patient name: Tara [name] MRN [id number]  Date of birth: 1996-04-25 Chief Complaint:   Postpartum Care  History of Present Illness:   Tara [name] is a 27 y.o. G42P1102 female being seen today for a postpartum visit. She is 6 weeks postpartum following a spontaneous vaginal delivery at 40.2 gestational weeks. IOL: yes, for elective. Anesthesia: epidural.  Laceration: none.  Complications: none. Inpatient contraception: yes BTS .   Pregnancy uncomplicated. Tobacco use: no. Substance use disorder: no. Last pap smear: 01/26/22 and results were NILM w/ HRHPV negative. Next pap smear due: 2026 Patient's last menstrual period was 08/15/2023.  Postpartum course has been complicated by PPHTN, d/c'd on lasix x 5d, nifedipine 30mg  daily-stopped 2d ago as directed . Bleeding none. Bowel function is normal. Bladder function is normal. Urinary incontinence? no, fecal incontinence? no Patient is sexually active. Last sexual activity:  did not discuss . Desired contraception: s/p BTS. Patient does not want a pregnancy in the future.  Desired family size is 2 children.   Upstream - 08/22/23 1400       Pregnancy Intention Screening   Does the patient want to become pregnant in the next year? No    Does the patient's partner want to become pregnant in the next year? No    Would the patient like to discuss contraceptive options today? No      Contraception Wrap Up   Current Method Female Sterilization    End Method Female Sterilization    Contraception Counseling Provided No            The pregnancy intention screening data noted above was reviewed. Potential methods of contraception were discussed. The patient elected to proceed with Female Sterilization.  Edinburgh Postpartum Depression Screening: positive, no h/o anx/dep, maybe had some PPD after 1st baby but didn't discuss. Not sleeping well, mind won't shut off. 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 Good support. Still finds joy in reading. Denies SI/HI/II. Has appt w/ PCP [facility]) this Friday to discuss PPD.   Edinburgh Postnatal Depression Scale - 08/22/23 1354       Edinburgh Postnatal Depression Scale:  In the Past 7 Days   I have been able to laugh and see the funny side of things. 2    I have looked forward with enjoyment to things. 1    I have blamed myself unnecessarily when things went wrong. 3    I have been anxious or worried for no good reason. 2    I have felt scared or panicky for no good reason. 2    Things have been getting on top of me. 2    I have been so unhappy that I have had difficulty sleeping. 2    I have felt sad or miserable. 2    I have been so unhappy that I have been crying. 2    The thought of harming myself has occurred to me. 0    Edinburgh Postnatal Depression Scale Total 18                04/14/2023    9:26 AM 01/03/2023    3:35 PM 01/26/2022    2:23 PM  GAD 7 : Generalized Anxiety Score  Nervous, Anxious, on Edge 0 1 1  Control/stop worrying 1 1 0  Worry too much - different things 1 1 0  Trouble relaxing 0 0 0  Restless 0 0 0  Easily annoyed  or irritable 1 2 1   Afraid - awful might happen 1 0 0  Total GAD 7 Score 4 5 2      Baby's course has been uncomplicated. Baby is feeding by bottle. Infant has a pediatrician/family doctor? Yes.  Childcare strategy if returning to work/school: n/a-stay at home mom.  Pt has material needs met for her and baby: Yes.   Review of Systems:   Pertinent items are noted in HPI Denies Abnormal vaginal discharge w/ itching/odor/irritation, headaches, visual changes, shortness of breath, chest pain, abdominal pain, severe nausea/vomiting, or problems with urination or bowel movements. Pertinent History Reviewed:  Reviewed past medical,surgical, obstetrical and family history.  Reviewed problem list, medications and allergies. OB History  Gravida Para Term Preterm AB Living  2 2 1 1   2   SAB  IAB Ectopic Multiple Live Births        0 2    # Outcome Date GA Lbr Len/2nd Weight Sex Type Anes PTL Lv  2 Term 07/11/23 [redacted]w[redacted]d 09:01 / 00:10 6 lb 13.7 oz (3.11 kg) F Vag-Spont EPI  LIV  1 Preterm 10/10/18 [redacted]w[redacted]d 03:59 / 01:23 5 lb 5.5 oz (2.425 kg) M Vag-Spont EPI N LIV   Physical Assessment:   Vitals:   08/22/23 1351  BP: 123/86  Pulse: 85  Weight: 133 lb 12.8 oz (60.7 kg)  Height: 4\' 9"  (1.448 m)  Body mass index is 28.95 kg/m.       Physical Examination:   General appearance: alert, well appearing, and in no distress  Mental status: alert, oriented to person, place, and time  Skin: warm & dry   Cardiovascular: normal heart rate noted   Respiratory: normal respiratory effort, no distress   Breasts: deferred, no complaints   Abdomen: soft, non-tender, BTS incision well healed  Pelvic: examination not indicated. Thin prep pap obtained: No  Rectal: not examined  Extremities: Edema: none   Chaperone: N/A         No results found for this or any previous visit (from the past 24 hour(s)).  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  Mood and well being:  If positive depression screen, discussed and plan developed.  If using tobacco we discussed reduction/cessation and risk of relapse If current substance abuse, we discussed and referral to local resources was offered.   2. Infant care and feeding:  If breastfeeding, discussed returning to work, pumping, breastfeeding-associated pain, guidance regarding return to fertility while lactating if not using another method. If needed, patient was provided with a letter to be allowed to pump q 2-3hrs to support lactation in a private location with  access to a refrigerator to store breastmilk.   Recommended that all caregivers be immunized for flu, pertussis and other preventable communicable diseases If pt does not have material needs met for her/baby, referred to local resources for help obtaining these.  3. Sexuality, contraception and birth spacing Provided guidance regarding sexuality, management of dyspareunia, and resumption of intercourse Discussed avoiding interpregnancy interval <57mths and recommended birth spacing of 18 months  4. Sleep and fatigue Discussed coping options for fatigue and sleep disruption Encouraged family/partner/community support of 4 hrs of uninterrupted sleep to help with mood and fatigue  5. Physical recovery  If pt had a C/S, assessed incisional pain and providing guidance on normal vs prolonged recovery If pt had a laceration, perineal healing and pain reviewed.  If urinary or fecal incontinence, discussed management and referred to PT or uro/gyn if indicated  Patient is safe to resume physical activity. Discussed attainment of healthy weight.  6.  Chronic disease management Discussed pregnancy complications if any, and their implications for future childbearing and long-term maternal health. Review recommendations for prevention of recurrent pregnancy complications, such as 17 hydroxyprogesterone caproate to reduce risk for recurrent PTB not applicable, or aspirin to reduce risk of preeclampsia n/a-s/p BTS. Pt had GDM: no. If yes, 2hr GTT scheduled: not applicable. Reviewed medications and non-pregnant dosing including consideration of whether pt is breastfeeding using a reliable resource such as LactMed: not applicable Referred for f/u w/ PCP or subspecialist providers as indicated: yes  7. Health maintenance Mammogram at 27yo or earlier if indicated Pap smears as indicated  Meds: No orders of the defined types were placed in this encounter.   Follow-up: Return in about 1 year (around  08/21/2024) for Physical.   No orders of the defined types were placed in this encounter.   Cheral Marker CNM, [facility] 08/22/2023 2:27 PM

## 2023-08-26 ENCOUNTER — Ambulatory Visit: Payer: Medicaid Other | Admitting: Family Medicine

## 2023-09-27 ENCOUNTER — Encounter: Payer: Self-pay | Admitting: Family Medicine

## 2023-09-27 ENCOUNTER — Ambulatory Visit: Payer: Medicaid Other | Admitting: Family Medicine

## 2023-09-27 VITALS — BP 125/85 | HR 94 | Temp 97.5°F | Ht <= 58 in | Wt 133.0 lb

## 2023-09-27 DIAGNOSIS — F419 Anxiety disorder, unspecified: Secondary | ICD-10-CM | POA: Diagnosis not present

## 2023-09-27 DIAGNOSIS — F53 Postpartum depression: Secondary | ICD-10-CM | POA: Diagnosis not present

## 2023-09-27 DIAGNOSIS — N920 Excessive and frequent menstruation with regular cycle: Secondary | ICD-10-CM | POA: Diagnosis not present

## 2023-09-27 DIAGNOSIS — Z23 Encounter for immunization: Secondary | ICD-10-CM

## 2023-09-27 MED ORDER — SERTRALINE HCL 25 MG PO TABS
25.0000 mg | ORAL_TABLET | Freq: Every day | ORAL | 3 refills | Status: DC
Start: 2023-09-27 — End: 2023-10-25

## 2023-09-27 NOTE — Progress Notes (Signed)
Subjective:  Patient ID: Tara [name], female    DOB: November 23, 1996, 27 y.o.   MRN: [id number]  Patient Care Team: Sonny Masters, FNP as PCP - General (Family Medicine)   Chief Complaint:  New Patient (Initial Visit), Establish Care, and Depression (After having daughter x 3 months ago. )   HPI: Tara [name] is a 27 y.o. female presenting on 09/27/2023 for New Patient (Initial Visit), Establish Care, and Depression (After having daughter x 3 months ago. )   Discussed the use of AI scribe software for clinical note transcription with the patient, who gave verbal consent to proceed.  History of Present Illness   Tara [name], also known as "Tara [name]," is a postpartum patient who has been experiencing symptoms of depression for approximately three months since the birth of her baby. She reports difficulty sleeping, frequent awakenings, and an inability to fall asleep until the early hours of the morning. During the day, she experiences fatigue, lack of motivation, and a general disinterest in activities. She denies any thoughts of self-harm or harm to others.  In terms of support, she lives with her husband, father-in-law, and brother-in-law. Her husband has recently started helping more with the baby, which she finds beneficial. She has no prior history of depression medication, but she did receive counseling as a teenager due to self-harm behaviors. She also suspects she may have experienced postpartum depression after the birth of her first child, but she did not seek evaluation or treatment at that time.  Family history is significant for depression, with both her mother and grandmother having been diagnosed. Her mother is currently on medication, but the specific medication is unknown.  In addition to her mental health concerns, she has noticed changes in her menstrual cycle since giving birth. Her periods have become significantly heavier, and she experiences severe cramping that requires  medication for relief. She has not started any postpartum birth control as she had a tubal ligation.  She plans to discuss these symptoms with her gynecologist.         09/27/2023   11:35 AM 04/14/2023    9:24 AM 01/03/2023    3:35 PM 01/26/2022    2:23 PM 01/14/2022    3:17 PM  Depression screen PHQ 2/9  Decreased Interest 1 1 1 1  0  Down, Depressed, Hopeless 2 0 0 1 0  PHQ - 2 Score 3 1 1 2  0  Altered sleeping 2 2 2 1    Tired, decreased energy 2 1 1 1    Change in appetite 2 0 1 1   Feeling bad or failure about yourself  2 0 0 1   Trouble concentrating 1 0 0 0   Moving slowly or fidgety/restless 0 0 0 0   Suicidal thoughts 0 0 0 0   PHQ-9 Score 12 4 5 6    Difficult doing work/chores Somewhat difficult Not difficult at all         09/27/2023   11:35 AM 04/14/2023    9:26 AM 01/03/2023    3:35 PM 01/26/2022    2:23 PM  GAD 7 : Generalized Anxiety Score  Nervous, Anxious, on Edge 1 0 1 1  Control/stop worrying 2 1 1  0  Worry too much - different things 1 1 1  0  Trouble relaxing 1 0 0 0  Restless 1 0 0 0  Easily annoyed or irritable 2 1 2 1   Afraid - awful might happen 1 1 0 0  Total GAD 7  Score 9 4 5 2   Anxiety Difficulty Somewhat difficult           Relevant past medical, surgical, family, and social history reviewed and updated as indicated.  Allergies and medications reviewed and updated. Data reviewed: Chart in Epic.   Past Medical History:  Diagnosis Date   Hx of preeclampsia, prior pregnancy, currently pregnant 01/03/2023   Pre-eclampsia     Past Surgical History:  Procedure Laterality Date   NO PAST SURGERIES     TUBAL LIGATION N/A 07/12/2023   Procedure: POST PARTUM TUBAL LIGATION;  Surgeon: Warden Fillers, MD;  Location: [facility];  Service: Gynecology;  Laterality: N/A;   WISDOM TOOTH EXTRACTION      Social History   Socioeconomic History   Marital status: Married    Spouse name: Not on file   Number of children: 2   Years of education: Not on file    Highest education level: Some college, no degree  Occupational History   Not on file  Tobacco Use   Smoking status: Every Day    Current packs/day: 0.25    Average packs/day: 0.3 packs/day for 10.0 years (2.5 ttl pk-yrs)    Types: Cigarettes   Smokeless tobacco: Never   Tobacco comments:    smokes 5-10 cig daily  Vaping Use   Vaping status: Never Used  Substance and Sexual Activity   Alcohol use: Yes    Comment: occ   Drug use: No   Sexual activity: Yes    Partners: Male    Birth control/protection: None  Other Topics Concern   Not on file  Social History Narrative   Not on file   Social Determinants of Health   Financial Resource Strain: Low Risk  (09/26/2023)   Overall Financial Resource Strain (CARDIA)    Difficulty of Paying Living Expenses: Not hard at all  Food Insecurity: No Food Insecurity (09/26/2023)   Hunger Vital Sign    Worried About Running Out of Food in the Last Year: Never true    Ran Out of Food in the Last Year: Never true  Transportation Needs: No Transportation Needs (09/26/2023)   PRAPARE - Administrator, Civil Service (Medical): No    Lack of Transportation (Non-Medical): No  Physical Activity: Insufficiently Active (09/26/2023)   Exercise Vital Sign    Days of Exercise per Week: 2 days    Minutes of Exercise per Session: 30 min  Stress: Stress Concern Present (09/26/2023)   [employer] of Occupational Health - Occupational Stress Questionnaire    Feeling of Stress : To some extent  Social Connections: Moderately Isolated (09/26/2023)   Social Connection and Isolation Panel [NHANES]    Frequency of Communication with Friends and Family: Three times a week    Frequency of Social Gatherings with Friends and Family: Once a week    Attends Religious Services: Never    Database administrator or Organizations: No    Attends Engineer, structural: Not on file    Marital Status: Married  Catering manager Violence: Not At  Risk (01/03/2023)   Humiliation, Afraid, Rape, and Kick questionnaire    Fear of Current or Ex-Partner: No    Emotionally Abused: No    Physically Abused: No    Sexually Abused: No    Outpatient Encounter Medications as of 09/27/2023  Medication Sig   sertraline (ZOLOFT) 25 MG tablet Take 1 tablet (25 mg total) by mouth daily.   [DISCONTINUED] acetaminophen (TYLENOL)  325 MG tablet Take 2 tablets (650 mg total) by mouth every 6 (six) hours as needed for moderate pain, fever or headache. (Patient not taking: Reported on 08/22/2023)   [DISCONTINUED] cyclobenzaprine (FLEXERIL) 10 MG tablet Take 1 tablet (10 mg total) by mouth every 8 (eight) hours as needed for muscle spasms.   [DISCONTINUED] furosemide (LASIX) 20 MG tablet Take 1 tablet (20 mg total) by mouth daily. (Patient not taking: Reported on 07/20/2023)   [DISCONTINUED] ibuprofen (ADVIL) 600 MG tablet Take 1 tablet (600 mg total) by mouth every 8 (eight) hours as needed for moderate pain. Take with meals (Patient not taking: Reported on 08/22/2023)   [DISCONTINUED] NIFEdipine (ADALAT CC) 30 MG 24 hr tablet Take 1 tablet (30 mg total) by mouth daily.   [DISCONTINUED] Prenatal Vit-Fe Fumarate-FA (PRENATAL PO) Take 1 tablet by mouth at bedtime. (Patient not taking: Reported on 07/20/2023)   No facility-administered encounter medications on file as of 09/27/2023.    No Known Allergies  Pertinent ROS per HPI, otherwise unremarkable      Objective:  BP 125/85   Pulse 94   Temp (!) 97.5 F (36.4 C) (Temporal)   Ht 4\' 10"  (1.473 m)   Wt 133 lb (60.3 kg)   LMP 09/20/2023   SpO2 97%   BMI 27.80 kg/m    Wt Readings from Last 3 Encounters:  09/27/23 133 lb (60.3 kg)  08/22/23 133 lb 12.8 oz (60.7 kg)  07/20/23 142 lb (64.4 kg)    Physical Exam Vitals and nursing note reviewed.  Constitutional:      General: She is not in acute distress.    Appearance: Normal appearance. She is well-developed and well-groomed. She is not  ill-appearing, toxic-appearing or diaphoretic.  HENT:     Head: Normocephalic and atraumatic.     Jaw: There is normal jaw occlusion.     Right Ear: Hearing normal.     Left Ear: Hearing normal.     Nose: Nose normal.     Mouth/Throat:     Lips: Pink.     Mouth: Mucous membranes are moist.     Pharynx: Uvula midline.  Eyes:     General: Lids are normal.     Conjunctiva/sclera: Conjunctivae normal.     Pupils: Pupils are equal, round, and reactive to light.  Neck:     Thyroid: No thyroid mass, thyromegaly or thyroid tenderness.     Trachea: Trachea and phonation normal.  Cardiovascular:     Rate and Rhythm: Normal rate and regular rhythm.     Chest Wall: PMI is not displaced.     Pulses: Normal pulses.     Heart sounds: Normal heart sounds. No murmur heard.    No friction rub. No gallop.  Pulmonary:     Effort: Pulmonary effort is normal. No respiratory distress.     Breath sounds: Normal breath sounds. No wheezing.  Abdominal:     General: Bowel sounds are normal. There is no abdominal bruit.     Palpations: Abdomen is soft. There is no hepatomegaly or splenomegaly.  Musculoskeletal:        General: Normal range of motion.     Cervical back: Normal range of motion and neck supple.     Right lower leg: No edema.     Left lower leg: No edema.  Skin:    General: Skin is warm and dry.     Capillary Refill: Capillary refill takes less than 2 seconds.     Coloration:  Skin is not cyanotic, jaundiced or pale.     Findings: No rash.  Neurological:     General: No focal deficit present.     Mental Status: She is alert and oriented to person, place, and time.     Sensory: Sensation is intact.     Motor: Motor function is intact.     Coordination: Coordination is intact.     Gait: Gait is intact.     Deep Tendon Reflexes: Reflexes are normal and symmetric.  Psychiatric:        Attention and Perception: Attention and perception normal.        Mood and Affect: Mood normal. Affect  is flat.        Speech: Speech normal.        Behavior: Behavior normal. Behavior is cooperative.        Thought Content: Thought content normal.        Cognition and Memory: Cognition and memory normal.        Judgment: Judgment normal.    Physical Exam            Results for orders placed or performed during the hospital encounter of 07/10/23  CBC  Result Value Ref Range   WBC 12.0 (H) 4.0 - 10.5 K/uL   RBC 4.03 3.87 - 5.11 MIL/uL   Hemoglobin 11.4 (L) 12.0 - 15.0 g/dL   HCT 16.1 (L) 09.6 - 04.5 %   MCV 84.6 80.0 - 100.0 fL   MCH 28.3 26.0 - 34.0 pg   MCHC 33.4 30.0 - 36.0 g/dL   RDW 40.9 81.1 - 91.4 %   Platelets 223 150 - 400 K/uL   nRBC 0.2 0.0 - 0.2 %  RPR  Result Value Ref Range   RPR Ser Ql NON REACTIVE NON REACTIVE  CBC  Result Value Ref Range   WBC 12.5 (H) 4.0 - 10.5 K/uL   RBC 3.14 (L) 3.87 - 5.11 MIL/uL   Hemoglobin 8.8 (L) 12.0 - 15.0 g/dL   HCT 78.2 (L) 95.6 - 21.3 %   MCV 86.6 80.0 - 100.0 fL   MCH 28.0 26.0 - 34.0 pg   MCHC 32.4 30.0 - 36.0 g/dL   RDW 08.6 57.8 - 46.9 %   Platelets 160 150 - 400 K/uL   nRBC 0.0 0.0 - 0.2 %  Comprehensive metabolic panel  Result Value Ref Range   Sodium 138 135 - 145 mmol/L   Potassium 4.2 3.5 - 5.1 mmol/L   Chloride 105 98 - 111 mmol/L   CO2 24 22 - 32 mmol/L   Glucose, Bld 83 70 - 99 mg/dL   BUN 6 6 - 20 mg/dL   Creatinine, Ser 6.29 0.44 - 1.00 mg/dL   Calcium 8.3 (L) 8.9 - 10.3 mg/dL   Total Protein 5.4 (L) 6.5 - 8.1 g/dL   Albumin 2.4 (L) 3.5 - 5.0 g/dL   AST 22 15 - 41 U/L   ALT 13 0 - 44 U/L   Alkaline Phosphatase 167 (H) 38 - 126 U/L   Total Bilirubin 0.5 0.3 - 1.2 mg/dL   GFR, Estimated >52 >84 mL/min   Anion gap 9 5 - 15  Protein / creatinine ratio, urine  Result Value Ref Range   Creatinine, Urine 86 mg/dL   Total Protein, Urine 8 mg/dL   Protein Creatinine Ratio 0.09 0.00 - 0.15 mg/mg[Cre]  CBC  Result Value Ref Range   WBC 9.9 4.0 - 10.5 K/uL   RBC 3.22 (  L) 3.87 - 5.11 MIL/uL   Hemoglobin  9.3 (L) 12.0 - 15.0 g/dL   HCT 13.0 (L) 86.5 - 78.4 %   MCV 88.5 80.0 - 100.0 fL   MCH 28.9 26.0 - 34.0 pg   MCHC 32.6 30.0 - 36.0 g/dL   RDW 69.6 29.5 - 28.4 %   Platelets 160 150 - 400 K/uL   nRBC 0.0 0.0 - 0.2 %  Type and screen  Result Value Ref Range   ABO/RH(D) O POS    Antibody Screen NEG    Sample Expiration      07/13/2023,2359 Performed at [facility], [address], [place], [place] [address]   Surgical pathology  Result Value Ref Range   SURGICAL PATHOLOGY      SURGICAL PATHOLOGY CASE: [id number] [phone] PATIENT: Tara [name] Surgical Pathology Report     Clinical History: None provided     FINAL MICROSCOPIC DIAGNOSIS:  A.  RIGHT AND LEFT FALLOPIAN TUBE, BILATERAL SALPINGECTOMY: Benign fallopian tubes   GROSS DESCRIPTION:  A. Received in formalin labeled with the patient's name and "Left and right fallopian tube" are two undesignated red-tan, fimbriated fallopian tubes, 7.8 x 0.3 cm and 7.9 x 0.3 cm, with unremarkable cut and serosal surfaces. The fimbria and representative cross sections are each submitted in their own cassettes (2 blocks total).  (LEF 07/13/2023)   Final Diagnosis performed by Jerene Bears, MD.   Electronically signed 07/14/2023 Technical component performed at [facility], [address], [place], [place] [address].  Professional component performed at [facility], [address] [place], [place] [address].  Immunohistochemistry Technical component  (if applicable) was performed at [facility] [address], [place], [place] [address].   IMMUNOHISTOCHEMISTRY DISCLAIMER (if applicable): Some of these immunohistochemical stains may have been developed and the performance characteristics determine by [facility]. Some may not have been cleared or approved by the U.S. Food and Drug Administration. The FDA has determined that such clearance  or approval is not necessary. This test is used for clinical purposes. It should not be regarded as investigational or for research. This laboratory is certified under the Clinical Laboratory Improvement Amendments of 1988 (CLIA-88) as qualified to perform high complexity clinical laboratory testing.  The controls stained appropriately.   IHC stains are performed on formalin fixed, paraffin embedded tissue using a 3,3"diaminobenzidine (DAB) chromogen and Leica Bond Autostainer System. The staining intensity of the nucleus is score manually and  is reported as the percentage of tumor cell nuclei demonstrating specific nuclear staining. The specimens are fixed in 10% Neutral Formalin for at least 6 hours and up to 72hrs. These tests are validated on decalcified tissue. Results should be interpreted with caution given the possibility of false negative results on decalcified specimens. Antibody Clones are as follows ER-clone [id number], PR-clone 16, Ki67- clone MM1. Some of these immunohistochemical stains may have been developed and the performance characteristics determined by [facility].        Pertinent labs & imaging results that were available during my care of the patient were reviewed by me and considered in my medical decision making.  Assessment & Plan:  Ciro Backer" was seen today for new patient (initial visit), establish care and depression.  Diagnoses and all orders for this visit:  Postpartum depression -     sertraline (ZOLOFT) 25 MG tablet; Take 1 tablet (25 mg total) by mouth daily. -     CMP14+EGFR -  CBC with Differential/Platelet -     Thyroid Panel With TSH  Anxiety -     sertraline (ZOLOFT) 25 MG tablet; Take 1 tablet (25 mg total) by mouth daily. -     CMP14+EGFR -     CBC with Differential/Platelet -     Thyroid Panel With TSH  Menorrhagia with regular cycle -     CBC with Differential/Platelet  Encounter for immunization -     Flu vaccine  trivalent PF, 6mos and older(Flulaval,Afluria,Fluarix,Fluzone)     Assessment and Plan    Postpartum Depression Persistent symptoms of insomnia, anhedonia, and fatigue. No suicidal ideation or thoughts of harming the baby. History of self-harm as a teenager. Family history of depression. -Start Sertraline 25mg  at bedtime. -Check thyroid function, complete blood count, and other relevant labs to rule out physiological causes of depression. -Follow-up in 4 weeks to assess response to medication.  Menorrhagia and Dysmenorrhea Reports of heavy menstrual bleeding and severe cramps since giving birth. History of tubal ligation. -Refer to Gynecology for further evaluation. -Check complete blood count to assess for anemia.  General Health Maintenance -Administer influenza vaccine today.          Continue all other maintenance medications.  Follow up plan: Return in about 4 weeks (around 10/25/2023), or if symptoms worsen or fail to improve, for Depression .   Continue healthy lifestyle choices, including diet (rich in fruits, vegetables, and lean proteins, and low in salt and simple carbohydrates) and exercise (at least 30 minutes of moderate physical activity daily).  Educational handout given for depression and crisis intervention hotline numbers  The above assessment and management plan was discussed with the patient. The patient verbalized understanding of and has agreed to the management plan. Patient is aware to call the clinic if they develop any new symptoms or if symptoms persist or worsen. Patient is aware when to return to the clinic for a follow-up visit. Patient educated on when it is appropriate to go to the emergency department.   Kari Baars, FNP-C [facility] [phone]

## 2023-09-27 NOTE — Patient Instructions (Signed)

## 2023-09-28 LAB — CBC WITH DIFFERENTIAL/PLATELET
Basophils Absolute: 0 10*3/uL (ref 0.0–0.2)
Basos: 1 %
EOS (ABSOLUTE): 0.2 10*3/uL (ref 0.0–0.4)
Eos: 3 %
Hematocrit: 44.8 % (ref 34.0–46.6)
Hemoglobin: 14.6 g/dL (ref 11.1–15.9)
Immature Grans (Abs): 0 10*3/uL (ref 0.0–0.1)
Immature Granulocytes: 0 %
Lymphocytes Absolute: 3 10*3/uL (ref 0.7–3.1)
Lymphs: 45 %
MCH: 28.3 pg (ref 26.6–33.0)
MCHC: 32.6 g/dL (ref 31.5–35.7)
MCV: 87 fL (ref 79–97)
Monocytes Absolute: 0.4 10*3/uL (ref 0.1–0.9)
Monocytes: 6 %
Neutrophils Absolute: 3 10*3/uL (ref 1.4–7.0)
Neutrophils: 45 %
Platelets: 262 10*3/uL (ref 150–450)
RBC: 5.16 x10E6/uL (ref 3.77–5.28)
RDW: 13.5 % (ref 11.7–15.4)
WBC: 6.6 10*3/uL (ref 3.4–10.8)

## 2023-09-28 LAB — THYROID PANEL WITH TSH
Free Thyroxine Index: 2.3 (ref 1.2–4.9)
T3 Uptake Ratio: 27 % (ref 24–39)
T4, Total: 8.6 ug/dL (ref 4.5–12.0)
TSH: 0.529 u[IU]/mL (ref 0.450–4.500)

## 2023-09-28 LAB — CMP14+EGFR
ALT: 16 [IU]/L (ref 0–32)
AST: 16 [IU]/L (ref 0–40)
Albumin: 4.5 g/dL (ref 4.0–5.0)
Alkaline Phosphatase: 104 [IU]/L (ref 44–121)
BUN/Creatinine Ratio: 11 (ref 9–23)
BUN: 8 mg/dL (ref 6–20)
Bilirubin Total: 0.3 mg/dL (ref 0.0–1.2)
CO2: 19 mmol/L — ABNORMAL LOW (ref 20–29)
Calcium: 9.4 mg/dL (ref 8.7–10.2)
Chloride: 103 mmol/L (ref 96–106)
Creatinine, Ser: 0.74 mg/dL (ref 0.57–1.00)
Globulin, Total: 2.7 g/dL (ref 1.5–4.5)
Glucose: 110 mg/dL — ABNORMAL HIGH (ref 70–99)
Potassium: 4 mmol/L (ref 3.5–5.2)
Sodium: 140 mmol/L (ref 134–144)
Total Protein: 7.2 g/dL (ref 6.0–8.5)
eGFR: 114 mL/min/{1.73_m2} (ref >59)

## 2023-10-25 ENCOUNTER — Ambulatory Visit: Payer: Medicaid Other | Admitting: Family Medicine

## 2023-10-25 ENCOUNTER — Encounter: Payer: Self-pay | Admitting: Family Medicine

## 2023-10-25 VITALS — BP 115/79 | HR 99 | Temp 97.9°F | Ht <= 58 in | Wt 129.0 lb

## 2023-10-25 DIAGNOSIS — F419 Anxiety disorder, unspecified: Secondary | ICD-10-CM

## 2023-10-25 DIAGNOSIS — G479 Sleep disorder, unspecified: Secondary | ICD-10-CM | POA: Diagnosis not present

## 2023-10-25 DIAGNOSIS — F53 Postpartum depression: Secondary | ICD-10-CM

## 2023-10-25 MED ORDER — SERTRALINE HCL 50 MG PO TABS: 50.0000 mg | ORAL_TABLET | Freq: Every day | ORAL | 3 refills | Status: DC

## 2023-10-25 NOTE — Progress Notes (Signed)
Subjective:  Patient ID: [name], female    DOB: 03/03/1996, 27 y.o.   MRN: [id number]  Patient Care Team: Sonny Masters, FNP as PCP - General (Family Medicine)   Chief Complaint:  Depression (4 week follow up )   HPI: Aljawhara Griebel is a 27 y.o. female presenting on 10/25/2023 for Depression (4 week follow up )   Discussed the use of AI scribe software for clinical note transcription with the patient, who gave verbal consent to proceed.  History of Present Illness   The patient, on a regimen of Zoloft 25mg  for an unspecified mental health condition, reported a significant emotional event last week. She experienced her first argument with her spouse, which led to feelings of stress, depression, and anxiety. The patient reported crying a lot and feeling upset due to a perceived lack of sympathy or concern from her spouse. Since the incident, the patient reported some improvement in her emotional state.  Despite the initial effectiveness of the Zoloft, the patient felt that the recent stressful event triggered a resurgence of her symptoms. She expressed a desire to increase the dosage to 50mg . The patient reported a history of insomnia predating the initiation of Zoloft, for which she was not taking any medication.  The patient disclosed a history of self-harm through cutting during her teenage years. However, she denied any recent self-harm or suicidal ideation, except for a brief episode on the night of the argument with her spouse but no recurrent thoughts. The patient confirmed having a good support system at home.         10/25/2023   12:21 PM 09/27/2023   11:35 AM 04/14/2023    9:24 AM 01/03/2023    3:35 PM 01/26/2022    2:23 PM  Depression screen PHQ 2/9  Decreased Interest 2 1 1 1 1   Down, Depressed, Hopeless 2 2 0 0 1  PHQ - 2 Score 4 3 1 1 2   Altered sleeping 3 2 2 2 1   Tired, decreased energy 2 2 1 1 1   Change in appetite 3 2 0 1 1  Feeling bad or failure  about yourself  2 2 0 0 1  Trouble concentrating 2 1 0 0 0  Moving slowly or fidgety/restless 0 0 0 0 0  Suicidal thoughts 0 0 0 0 0  PHQ-9 Score 16 12 4 5 6   Difficult doing work/chores Very difficult Somewhat difficult Not difficult at all        10/25/2023   12:21 PM 09/27/2023   11:35 AM 04/14/2023    9:26 AM 01/03/2023    3:35 PM  GAD 7 : Generalized Anxiety Score  Nervous, Anxious, on Edge 2 1 0 1  Control/stop worrying 2 2 1 1   Worry too much - different things 2 1 1 1   Trouble relaxing 2 1 0 0  Restless 1 1 0 0  Easily annoyed or irritable 2 2 1 2   Afraid - awful might happen 2 1 1  0  Total GAD 7 Score 13 9 4 5   Anxiety Difficulty Very difficult Somewhat difficult         Relevant past medical, surgical, family, and social history reviewed and updated as indicated.  Allergies and medications reviewed and updated. Data reviewed: Chart in Epic.   Past Medical History:  Diagnosis Date   Hx of preeclampsia, prior pregnancy, currently pregnant 01/03/2023   Pre-eclampsia     Past Surgical History:  Procedure Laterality Date  NO PAST SURGERIES     TUBAL LIGATION N/A 07/12/2023   Procedure: POST PARTUM TUBAL LIGATION;  Surgeon: Warden Fillers, MD;  Location: [facility] LD [facility];  Service: Gynecology;  Laterality: N/A;   WISDOM TOOTH EXTRACTION      Social History   Socioeconomic History   Marital status: Married    Spouse name: Not on file   Number of children: 2   Years of education: Not on file   Highest education level: Some college, no degree  Occupational History   Not on file  Tobacco Use   Smoking status: Every Day    Current packs/day: 0.25    Average packs/day: 0.3 packs/day for 10.0 years (2.5 ttl pk-yrs)    Types: Cigarettes   Smokeless tobacco: Never   Tobacco comments:    smokes 5-10 cig daily  Vaping Use   Vaping status: Never Used  Substance and Sexual Activity   Alcohol use: Yes    Comment: occ   Drug use: No   Sexual activity: Yes     Partners: Male    Birth control/protection: None  Other Topics Concern   Not on file  Social History Narrative   Not on file   Social Determinants of Health   Financial Resource Strain: Low Risk  (09/26/2023)   Overall Financial Resource Strain (CARDIA)    Difficulty of Paying Living Expenses: Not hard at all  Food Insecurity: No Food Insecurity (09/26/2023)   Hunger Vital Sign    Worried About Running Out of Food in the Last Year: Never true    Ran Out of Food in the Last Year: Never true  Transportation Needs: No Transportation Needs (09/26/2023)   PRAPARE - Administrator, Civil Service (Medical): No    Lack of Transportation (Non-Medical): No  Physical Activity: Insufficiently Active (09/26/2023)   Exercise Vital Sign    Days of Exercise per Week: 2 days    Minutes of Exercise per Session: 30 min  Stress: Stress Concern Present (09/26/2023)   [employer] of Occupational Health - Occupational Stress Questionnaire    Feeling of Stress : To some extent  Social Connections: Moderately Isolated (09/26/2023)   Social Connection and Isolation Panel [NHANES]    Frequency of Communication with Friends and Family: Three times a week    Frequency of Social Gatherings with Friends and Family: Once a week    Attends Religious Services: Never    Database administrator or Organizations: No    Attends Engineer, structural: Not on file    Marital Status: Married  Catering manager Violence: Not At Risk (01/03/2023)   Humiliation, Afraid, Rape, and Kick questionnaire    Fear of Current or Ex-Partner: No    Emotionally Abused: No    Physically Abused: No    Sexually Abused: No    Outpatient Encounter Medications as of 10/25/2023  Medication Sig   sertraline (ZOLOFT) 50 MG tablet Take 1 tablet (50 mg total) by mouth daily.   [DISCONTINUED] sertraline (ZOLOFT) 25 MG tablet Take 1 tablet (25 mg total) by mouth daily.   No facility-administered encounter  medications on file as of 10/25/2023.    No Known Allergies  Pertinent ROS per HPI, otherwise unremarkable      Objective:  BP 115/79   Pulse 99   Temp 97.9 F (36.6 C) (Temporal)   Ht 4\' 10"  (1.473 m)   Wt 129 lb (58.5 kg)   LMP 09/20/2023   SpO2 99%  BMI 26.96 kg/m    Wt Readings from Last 3 Encounters:  10/25/23 129 lb (58.5 kg)  09/27/23 133 lb (60.3 kg)  08/22/23 133 lb 12.8 oz (60.7 kg)    Physical Exam Vitals and nursing note reviewed.  Constitutional:      General: She is not in acute distress.    Appearance: Normal appearance. She is not ill-appearing, toxic-appearing or diaphoretic.  HENT:     Head: Normocephalic and atraumatic.     Mouth/Throat:     Mouth: Mucous membranes are moist.  Eyes:     Conjunctiva/sclera: Conjunctivae normal.     Pupils: Pupils are equal, round, and reactive to light.  Cardiovascular:     Rate and Rhythm: Normal rate and regular rhythm.     Heart sounds: Normal heart sounds.  Pulmonary:     Effort: Pulmonary effort is normal.     Breath sounds: Normal breath sounds.  Musculoskeletal:     Cervical back: Neck supple.     Right lower leg: No edema.     Left lower leg: No edema.  Skin:    General: Skin is warm and dry.     Capillary Refill: Capillary refill takes less than 2 seconds.  Neurological:     General: No focal deficit present.     Mental Status: She is alert and oriented to person, place, and time.  Psychiatric:        Mood and Affect: Mood normal.        Behavior: Behavior normal.        Thought Content: Thought content normal.        Judgment: Judgment normal.      Results for orders placed or performed in visit on 09/27/23  CMP14+EGFR  Result Value Ref Range   Glucose 110 (H) 70 - 99 mg/dL   BUN 8 6 - 20 mg/dL   Creatinine, Ser 0.86 0.57 - 1.00 mg/dL   eGFR 578 >46 NG/EXB/2.84   BUN/Creatinine Ratio 11 9 - 23   Sodium 140 134 - 144 mmol/L   Potassium 4.0 3.5 - 5.2 mmol/L   Chloride 103 96 - 106  mmol/L   CO2 19 (L) 20 - 29 mmol/L   Calcium 9.4 8.7 - 10.2 mg/dL   Total Protein 7.2 6.0 - 8.5 g/dL   Albumin 4.5 4.0 - 5.0 g/dL   Globulin, Total 2.7 1.5 - 4.5 g/dL   Bilirubin Total 0.3 0.0 - 1.2 mg/dL   Alkaline Phosphatase 104 44 - 121 IU/L   AST 16 0 - 40 IU/L   ALT 16 0 - 32 IU/L  CBC with Differential/Platelet  Result Value Ref Range   WBC 6.6 3.4 - 10.8 x10E3/uL   RBC 5.16 3.77 - 5.28 x10E6/uL   Hemoglobin 14.6 11.1 - 15.9 g/dL   Hematocrit 13.2 44.0 - 46.6 %   MCV 87 79 - 97 fL   MCH 28.3 26.6 - 33.0 pg   MCHC 32.6 31.5 - 35.7 g/dL   RDW 10.2 72.5 - 36.6 %   Platelets 262 150 - 450 x10E3/uL   Neutrophils 45 Not Estab. %   Lymphs 45 Not Estab. %   Monocytes 6 Not Estab. %   Eos 3 Not Estab. %   Basos 1 Not Estab. %   Neutrophils Absolute 3.0 1.4 - 7.0 x10E3/uL   Lymphocytes Absolute 3.0 0.7 - 3.1 x10E3/uL   Monocytes Absolute 0.4 0.1 - 0.9 x10E3/uL   EOS (ABSOLUTE) 0.2 0.0 - 0.4 x10E3/uL  Basophils Absolute 0.0 0.0 - 0.2 x10E3/uL   Immature Granulocytes 0 Not Estab. %   Immature Grans (Abs) 0.0 0.0 - 0.1 x10E3/uL  Thyroid Panel With TSH  Result Value Ref Range   TSH 0.529 0.450 - 4.500 uIU/mL   T4, Total 8.6 4.5 - 12.0 ug/dL   T3 Uptake Ratio 27 24 - 39 %   Free Thyroxine Index 2.3 1.2 - 4.9       Pertinent labs & imaging results that were available during my care of the patient were reviewed by me and considered in my medical decision making.  Assessment & Plan:  Shaunta "JALINA" was seen today for depression.  Diagnoses and all orders for this visit:  Anxiety -     sertraline (ZOLOFT) 50 MG tablet; Take 1 tablet (50 mg total) by mouth daily.  Postpartum depression -     sertraline (ZOLOFT) 50 MG tablet; Take 1 tablet (50 mg total) by mouth daily.  Sleep disorder     Assessment and Plan    Major Depressive Disorder Exacerbation of depressive symptoms following a significant argument with spouse. Currently on 25 mg of Zoloft, initially  effective. Symptoms include crying, and stress. No significant adverse side effects from Zoloft reported. History of self-harm as a teenager but no recent incidents. Discussed increasing Zoloft to 50 mg, with potential to titrate up to 200 mg if needed. Explained that dose changes take 4-6 weeks to assess effectiveness. Emphasized starting low and titrating slowly to minimize adverse side effects such as suicidal ideation or rebound symptoms. - Increase Zoloft to 50 mg daily - Follow up in 4 weeks - Provide crisis intervention numbers - Instruct to contact immediately if symptoms worsen or if suicidal ideation returns  Insomnia Ongoing trouble sleeping, predating Zoloft initiation. No current interventions for sleep. Discussed starting melatonin at 3 mg, with potential to increase up to 15 mg nightly. Advised on establishing a consistent nighttime routine, limiting caffeine and screen time before bed. - Start melatonin 3 mg nightly, increase up to 15 mg if needed - Establish a consistent nighttime routine - Limit caffeine and screen time before bed  Follow-up - Follow up in 4 weeks - Provide emergency contact numbers at checkout.          Continue all other maintenance medications.  Follow up plan: Return in about 4 weeks (around 11/22/2023), or if symptoms worsen or fail to improve, for GAD, Depression.   Continue healthy lifestyle choices, including diet (rich in fruits, vegetables, and lean proteins, and low in salt and simple carbohydrates) and exercise (at least 30 minutes of moderate physical activity daily).  Educational handout given for crisis intervention numbers  The above assessment and management plan was discussed with the patient. The patient verbalized understanding of and has agreed to the management plan. Patient is aware to call the clinic if they develop any new symptoms or if symptoms persist or worsen. Patient is aware when to return to the clinic for a follow-up  visit. Patient educated on when it is appropriate to go to the emergency department.   Kari Baars, FNP-C [facility] [phone]

## 2023-10-25 NOTE — Patient Instructions (Addendum)
Melatonin 3-15 mg nightly  If your symptoms worsen or you have thoughts of suicide/homicide, PLEASE SEEK IMMEDIATE MEDICAL ATTENTION.  You may always call the National Suicide Hotline.  This is available 24 hours a day, 7 days a week.  Their number is: [phone]  Taking the medicine as directed and not missing any doses is one of the best things you can do to treat your depression.  Here are some things to keep in mind:  Side effects (stomach upset, some increased anxiety) may happen before you notice a benefit.  These side effects typically go away over time. Changes to your dose of medicine or a change in medication all together is sometimes necessary Most people need to be on medication at least 12 months Many people will notice an improvement within two weeks but the full effect of the medication can take up to 4-6 weeks Stopping the medication when you start feeling better often results in a return of symptoms Never discontinue your medication without contacting a health care professional first.  Some medications require gradual discontinuation/ taper and can make you sick if you stop them abruptly.  If your symptoms worsen or you have thoughts of suicide/homicide, PLEASE SEEK IMMEDIATE MEDICAL ATTENTION.  You may always call:  Cell Phone [address] Suicide Hotline: [phone]) [phone] [facility] Health Crisis Line: [phone] Crisis Recovery in [place]: [phone]  These are available 24 hours a day, 7 days a week.

## 2023-11-22 ENCOUNTER — Ambulatory Visit: Payer: Medicaid Other | Admitting: Family Medicine

## 2023-11-23 ENCOUNTER — Encounter: Payer: Self-pay | Admitting: Family Medicine

## 2024-01-25 ENCOUNTER — Ambulatory Visit: Payer: Medicaid Other | Admitting: Family Medicine

## 2024-02-08 ENCOUNTER — Ambulatory Visit: Payer: Medicaid Other | Admitting: Family Medicine

## 2024-02-21 ENCOUNTER — Encounter: Payer: Self-pay | Admitting: *Deleted

## 2024-02-29 ENCOUNTER — Encounter: Payer: Self-pay | Admitting: Family Medicine

## 2024-02-29 ENCOUNTER — Ambulatory Visit: Admitting: Family Medicine

## 2024-02-29 VITALS — BP 123/61 | HR 95 | Temp 97.2°F | Ht <= 58 in | Wt 121.6 lb

## 2024-02-29 DIAGNOSIS — F53 Postpartum depression: Secondary | ICD-10-CM

## 2024-02-29 DIAGNOSIS — F419 Anxiety disorder, unspecified: Secondary | ICD-10-CM | POA: Diagnosis not present

## 2024-02-29 MED ORDER — SERTRALINE HCL 50 MG PO TABS
50.0000 mg | ORAL_TABLET | Freq: Every day | ORAL | 3 refills | Status: AC
Start: 2024-02-29 — End: ?

## 2024-02-29 NOTE — Progress Notes (Signed)
 Subjective:  Patient ID: Tara [name], female    DOB: 03/12/1996, 28 y.o.   MRN: [id number]  Patient Care Team: Sonny Masters, FNP as PCP - General (Family Medicine)   Chief Complaint:  Depression (4 week follow up )   HPI: Tara [name] is a 28 y.o. female presenting on 02/29/2024 for Depression (4 week follow up )   Discussed the use of AI scribe software for clinical note transcription with the patient, who gave verbal consent to proceed.  History of Present Illness   Tara [name] "Tara [name]" is a 28 year old female with postpartum depression who presents for follow-up on medication management.  Depression symptoms have significantly improved since starting on Zoloft 50 mg daily. She feels that her depression is 'much better' and believes it is well controlled. No major side effects such as headache or abdominal pain are reported.  She experiences irritability, particularly in response to minor provocations, which she attributes to anxiety and depression. She describes becoming irritable when her children talk to her about 'stupid stuff.'  No thoughts of self-harm or harm to others.         02/29/2024   11:43 AM 10/25/2023   12:21 PM 09/27/2023   11:35 AM 04/14/2023    9:24 AM 01/03/2023    3:35 PM  Depression screen PHQ 2/9  Decreased Interest 1 2 1 1 1   Down, Depressed, Hopeless 1 2 2  0 0  PHQ - 2 Score 2 4 3 1 1   Altered sleeping 1 3 2 2 2   Tired, decreased energy 1 2 2 1 1   Change in appetite 0 3 2 0 1  Feeling bad or failure about yourself  1 2 2  0 0  Trouble concentrating 0 2 1 0 0  Moving slowly or fidgety/restless 0 0 0 0 0  Suicidal thoughts 0 0 0 0 0  PHQ-9 Score 5 16 12 4 5   Difficult doing work/chores Somewhat difficult Very difficult Somewhat difficult Not difficult at all       02/29/2024   11:44 AM 10/25/2023   12:21 PM 09/27/2023   11:35 AM 04/14/2023    9:26 AM  GAD 7 : Generalized Anxiety Score  Nervous, Anxious, on Edge 1 2 1  0   Control/stop worrying 1 2 2 1   Worry too much - different things 0 2 1 1   Trouble relaxing 0 2 1 0  Restless 0 1 1 0  Easily annoyed or irritable 2 2 2 1   Afraid - awful might happen 0 2 1 1   Total GAD 7 Score 4 13 9 4   Anxiety Difficulty Somewhat difficult Very difficult Somewhat difficult         Relevant past medical, surgical, family, and social history reviewed and updated as indicated.  Allergies and medications reviewed and updated. Data reviewed: Chart in Epic.   Past Medical History:  Diagnosis Date   Hx of preeclampsia, prior pregnancy, currently pregnant 01/03/2023   Pre-eclampsia     Past Surgical History:  Procedure Laterality Date   NO PAST SURGERIES     TUBAL LIGATION N/A 07/12/2023   Procedure: POST PARTUM TUBAL LIGATION;  Surgeon: Warden Fillers, MD;  Location: [facility] LD OR[facility];  Service: Gynecology;  Laterality: N/A;   WISDOM TOOTH EXTRACTION      Social History   Socioeconomic History   Marital status: Married    Spouse name: Not on file   Number of children: 2   Years of education:  Not on file   Highest education level: Some college, no degree  Occupational History   Not on file  Tobacco Use   Smoking status: Every Day    Current packs/day: 0.25    Average packs/day: 0.3 packs/day for 10.0 years (2.5 ttl pk-yrs)    Types: Cigarettes   Smokeless tobacco: Never   Tobacco comments:    smokes 5-10 cig daily  Vaping Use   Vaping status: Never Used  Substance and Sexual Activity   Alcohol use: Yes    Comment: occ   Drug use: No   Sexual activity: Yes    Partners: Male    Birth control/protection: None  Other Topics Concern   Not on file  Social History Narrative   Not on file   Social Drivers of Health   Financial Resource Strain: Low Risk  (02/26/2024)   Overall Financial Resource Strain (CARDIA)    Difficulty of Paying Living Expenses: Not hard at all  Food Insecurity: No Food Insecurity (02/26/2024)   Hunger Vital Sign    Worried About  Running Out of Food in the Last Year: Never true    Ran Out of Food in the Last Year: Never true  Transportation Needs: No Transportation Needs (02/26/2024)   PRAPARE - Administrator, Civil Service (Medical): No    Lack of Transportation (Non-Medical): No  Physical Activity: Insufficiently Active (02/26/2024)   Exercise Vital Sign    Days of Exercise per Week: 1 day    Minutes of Exercise per Session: 30 min  Stress: Stress Concern Present (02/26/2024)   [employer] of Occupational Health - Occupational Stress Questionnaire    Feeling of Stress : Rather much  Social Connections: Moderately Isolated (02/26/2024)   Social Connection and Isolation Panel [NHANES]    Frequency of Communication with Friends and Family: Twice a week    Frequency of Social Gatherings with Friends and Family: Once a week    Attends Religious Services: Never    Database administrator or Organizations: No    Attends Engineer, structural: Not on file    Marital Status: Married  Catering manager Violence: Not At Risk (01/03/2023)   Humiliation, Afraid, Rape, and Kick questionnaire    Fear of Current or Ex-Partner: No    Emotionally Abused: No    Physically Abused: No    Sexually Abused: No    Outpatient Encounter Medications as of 02/29/2024  Medication Sig   [DISCONTINUED] sertraline (ZOLOFT) 50 MG tablet Take 1 tablet (50 mg total) by mouth daily.   sertraline (ZOLOFT) 50 MG tablet Take 1 tablet (50 mg total) by mouth daily.   No facility-administered encounter medications on file as of 02/29/2024.    No Known Allergies  Pertinent ROS per HPI, otherwise unremarkable      Objective:  BP 123/61   Pulse 95   Temp (!) 97.2 F (36.2 C)   Ht 4\' 10"  (1.473 m)   Wt 121 lb 9.6 oz (55.2 kg)   SpO2 97%   BMI 25.41 kg/m    Wt Readings from Last 3 Encounters:  02/29/24 121 lb 9.6 oz (55.2 kg)  10/25/23 129 lb (58.5 kg)  09/27/23 133 lb (60.3 kg)    Physical Exam Vitals and  nursing note reviewed.  Constitutional:      General: She is not in acute distress.    Appearance: Normal appearance. She is normal weight. She is not ill-appearing, toxic-appearing or diaphoretic.  HENT:  Head: Normocephalic and atraumatic.     Nose: Nose normal.     Mouth/Throat:     Mouth: Mucous membranes are moist.  Eyes:     Conjunctiva/sclera: Conjunctivae normal.     Pupils: Pupils are equal, round, and reactive to light.  Cardiovascular:     Rate and Rhythm: Normal rate and regular rhythm.     Heart sounds: Normal heart sounds.  Pulmonary:     Effort: Pulmonary effort is normal.     Breath sounds: Normal breath sounds.  Musculoskeletal:     Cervical back: Neck supple.  Skin:    General: Skin is warm and dry.     Capillary Refill: Capillary refill takes less than 2 seconds.  Neurological:     General: No focal deficit present.     Mental Status: She is alert and oriented to person, place, and time.  Psychiatric:        Mood and Affect: Mood normal.        Behavior: Behavior normal.        Thought Content: Thought content normal.        Judgment: Judgment normal.     Results for orders placed or performed in visit on 09/27/23  CMP14+EGFR   Collection Time: 09/27/23 11:36 AM  Result Value Ref Range   Glucose 110 (H) 70 - 99 mg/dL   BUN 8 6 - 20 mg/dL   Creatinine, Ser 1.61 0.57 - 1.00 mg/dL   eGFR 096 >04 VW/UJW/1.19   BUN/Creatinine Ratio 11 9 - 23   Sodium 140 134 - 144 mmol/L   Potassium 4.0 3.5 - 5.2 mmol/L   Chloride 103 96 - 106 mmol/L   CO2 19 (L) 20 - 29 mmol/L   Calcium 9.4 8.7 - 10.2 mg/dL   Total Protein 7.2 6.0 - 8.5 g/dL   Albumin 4.5 4.0 - 5.0 g/dL   Globulin, Total 2.7 1.5 - 4.5 g/dL   Bilirubin Total 0.3 0.0 - 1.2 mg/dL   Alkaline Phosphatase 104 44 - 121 IU/L   AST 16 0 - 40 IU/L   ALT 16 0 - 32 IU/L  CBC with Differential/Platelet   Collection Time: 09/27/23 11:36 AM  Result Value Ref Range   WBC 6.6 3.4 - 10.8 x10E3/uL   RBC 5.16  3.77 - 5.28 x10E6/uL   Hemoglobin 14.6 11.1 - 15.9 g/dL   Hematocrit 14.7 82.9 - 46.6 %   MCV 87 79 - 97 fL   MCH 28.3 26.6 - 33.0 pg   MCHC 32.6 31.5 - 35.7 g/dL   RDW 56.2 13.0 - 86.5 %   Platelets 262 150 - 450 x10E3/uL   Neutrophils 45 Not Estab. %   Lymphs 45 Not Estab. %   Monocytes 6 Not Estab. %   Eos 3 Not Estab. %   Basos 1 Not Estab. %   Neutrophils Absolute 3.0 1.4 - 7.0 x10E3/uL   Lymphocytes Absolute 3.0 0.7 - 3.1 x10E3/uL   Monocytes Absolute 0.4 0.1 - 0.9 x10E3/uL   EOS (ABSOLUTE) 0.2 0.0 - 0.4 x10E3/uL   Basophils Absolute 0.0 0.0 - 0.2 x10E3/uL   Immature Granulocytes 0 Not Estab. %   Immature Grans (Abs) 0.0 0.0 - 0.1 x10E3/uL  Thyroid Panel With TSH   Collection Time: 09/27/23 11:36 AM  Result Value Ref Range   TSH 0.529 0.450 - 4.500 uIU/mL   T4, Total 8.6 4.5 - 12.0 ug/dL   T3 Uptake Ratio 27 24 - 39 %   Free  Thyroxine Index 2.3 1.2 - 4.9       Pertinent labs & imaging results that were available during my care of the patient were reviewed by me and considered in my medical decision making.  Assessment & Plan:  Tara [name] "Tara [name]" was seen today for depression.  Diagnoses and all orders for this visit:  Anxiety -     sertraline (ZOLOFT) 50 MG tablet; Take 1 tablet (50 mg total) by mouth daily.  Postpartum depression -     sertraline (ZOLOFT) 50 MG tablet; Take 1 tablet (50 mg total) by mouth daily.     Assessment and Plan    Postpartum Depression Significant improvement in depression symptoms with Zoloft 50 mg. Denies major side effects such as headache or abdominal pain. History of postpartum depression and possibly undiagnosed depression prior to pregnancies. Reports some irritability, possibly related to anxiety or depression, but not severe enough to warrant medication change. Typically, postpartum depression treatment involves medication for about six months before tapering. Given her history and current progress, continuation of medication is  advised until asymptomatic. If asymptomatic at six months, tapering will be considered. If symptoms persist, long-term medication may be necessary, which is common and acceptable. - Continue Zoloft 50 mg daily. - Change prescription to a 90-day supply for convenience. - Schedule follow-up in three months to reassess symptoms and consider tapering medication if asymptomatic. - Instruct her to report any worsening of irritability or other symptoms, which may necessitate an increase in medication.          Continue all other maintenance medications.  Follow up plan: Return in about 6 months (around 08/31/2024), or if symptoms worsen or fail to improve, for Depression, Anxiety.   Continue healthy lifestyle choices, including diet (rich in fruits, vegetables, and lean proteins, and low in salt and simple carbohydrates) and exercise (at least 30 minutes of moderate physical activity daily).  Educational handout given for depression  The above assessment and management plan was discussed with the patient. The patient verbalized understanding of and has agreed to the management plan. Patient is aware to call the clinic if they develop any new symptoms or if symptoms persist or worsen. Patient is aware when to return to the clinic for a follow-up visit. Patient educated on when it is appropriate to go to the emergency department.   Kari Baars, FNP-C [facility] [phone]

## 2024-02-29 NOTE — Patient Instructions (Signed)
 If your symptoms worsen or you have thoughts of suicide/homicide, PLEASE SEEK IMMEDIATE MEDICAL ATTENTION.  You may always call the National Suicide Hotline.  This is available 24 hours a day, 7 days a week.  Their number is: 5810725763  Taking the medicine as directed and not missing any doses is one of the best things you can do to treat your depression.  Here are some things to keep in mind:  Side effects (stomach upset, some increased anxiety) may happen before you notice a benefit.  These side effects typically go away over time. Changes to your dose of medicine or a change in medication all together is sometimes necessary Most people need to be on medication at least 12 months Many people will notice an improvement within two weeks but the full effect of the medication can take up to 4-6 weeks Stopping the medication when you start feeling better often results in a return of symptoms Never discontinue your medication without contacting a health care professional first.  Some medications require gradual discontinuation/ taper and can make you sick if you stop them abruptly.  If your symptoms worsen or you have thoughts of suicide/homicide, PLEASE SEEK IMMEDIATE MEDICAL ATTENTION.  You may always call:  National Suicide Hotline: 440-761-4568 [place] Crisis Line: (424)588-3357 Crisis Recovery in [place]: [phone]  These are available 24 hours a day, 7 days a week.

## 2024-03-01 ENCOUNTER — Ambulatory Visit: Payer: Medicaid Other | Admitting: Family Medicine

## 2024-03-11 ENCOUNTER — Telehealth: Admitting: Family

## 2024-03-11 DIAGNOSIS — H109 Unspecified conjunctivitis: Secondary | ICD-10-CM

## 2024-03-11 MED ORDER — POLYMYXIN B-TRIMETHOPRIM 10000-0.1 UNIT/ML-% OP SOLN: 1.0000 [drp] | Freq: Four times a day (QID) | OPHTHALMIC | 0 refills | Status: DC

## 2024-03-11 MED ORDER — CETIRIZINE HCL 10 MG PO TABS
10.0000 mg | ORAL_TABLET | Freq: Every day | ORAL | 1 refills | Status: DC
Start: 2024-03-11 — End: 2024-06-05

## 2024-03-11 NOTE — Progress Notes (Signed)
 Virtual Visit Consent   [name], you are scheduled for a virtual visit with a  provider today. Just as with appointments in the office, your consent must be obtained to participate. Your consent will be active for this visit and any virtual visit you may have with one of our providers in the next 365 days. If you have a MyChart account, a copy of this consent can be sent to you electronically.  As this is a virtual visit, video technology does not allow for your provider to perform a traditional examination. This may limit your provider's ability to fully assess your condition. If your provider identifies any concerns that need to be evaluated in person or the need to arrange testing (such as labs, EKG, etc.), we will make arrangements to do so. Although advances in technology are sophisticated, we cannot ensure that it will always work on either your end or our end. If the connection with a video visit is poor, the visit may have to be switched to a telephone visit. With either a video or telephone visit, we are not always able to ensure that we have a secure connection.  By engaging in this virtual visit, you consent to the provision of healthcare and authorize for your insurance to be billed (if applicable) for the services provided during this visit. Depending on your insurance coverage, you may receive a charge related to this service.  I need to obtain your verbal consent now. Are you willing to proceed with your visit today? [name] has provided verbal consent on 03/11/2024 for a virtual visit (video or telephone). [name], FNP  Date: 03/11/2024 12:03 PM   Virtual Visit via Video Note   I, [name], connected with  Tara [name]  ([id number], 04-07-1996) on 03/11/24 at 12:00 PM EDT by a video-enabled telemedicine application and verified that I am speaking with the correct person using two identifiers.  Location: Patient: Virtual Visit Location Patient:  Home Provider: Virtual Visit Location Provider: Home Office   I discussed the limitations of evaluation and management by telemedicine and the availability of in person appointments. The patient expressed understanding and agreed to proceed.    History of Present Illness: Tara [name] is a 28 y.o. who identifies as a female who was assigned female at birth, and is being seen today for right eye redness.  HPI: Conjunctivitis  The current episode started 5 to 7 days ago. The problem occurs frequently. The problem is mild. Associated symptoms include eye itching, headaches, rhinorrhea, sore throat, eye discharge, eye pain and eye redness. Pertinent negatives include no fever, no double vision and no photophobia.    Problems:  Patient Active Problem List   Diagnosis Date Noted   History of gestational hypertension 07/13/2023   Status post tubal ligation 07/13/2023   H/O pre-e & PPHTN 01/03/2023   History of preterm delivery 01/03/2023   Smoker 01/03/2023    Allergies: No Known Allergies Medications:  Current Outpatient Medications:    cetirizine (ZYRTEC ALLERGY) 10 MG tablet, Take 1 tablet (10 mg total) by mouth daily., Disp: 90 tablet, Rfl: 1   trimethoprim-polymyxin b (POLYTRIM) ophthalmic solution, Place 1 drop into the right eye every 6 (six) hours., Disp: 10 mL, Rfl: 0   sertraline (ZOLOFT) 50 MG tablet, Take 1 tablet (50 mg total) by mouth daily., Disp: 90 tablet, Rfl: 3  Observations/Objective: Patient is well-developed, well-nourished in no acute distress.  Resting comfortably  at home.  Head is normocephalic, atraumatic.  No labored breathing.  Speech is clear and coherent with logical content.  Patient is alert and oriented at baseline.  Right eye erythemas   Assessment and Plan: 1. Bacterial conjunctivitis of right eye (Primary) - trimethoprim-polymyxin b (POLYTRIM) ophthalmic solution; Place 1 drop into the right eye every 6 (six) hours.  Dispense: 10 mL; Refill:  0  Avoid rubbing Good hand hygiene  Cool wash cloth Follow up If symptoms worsen or do not improve   Follow Up Instructions: I discussed the assessment and treatment plan with the patient. The patient was provided an opportunity to ask questions and all were answered. The patient agreed with the plan and demonstrated an understanding of the instructions.  A copy of instructions were sent to the patient via MyChart unless otherwise noted below.     The patient was advised to call back or seek an in-person evaluation if the symptoms worsen or if the condition fails to improve as anticipated.    [name], FNP

## 2024-06-05 ENCOUNTER — Telehealth: Admitting: Family Medicine

## 2024-06-05 DIAGNOSIS — F419 Anxiety disorder, unspecified: Secondary | ICD-10-CM | POA: Diagnosis not present

## 2024-06-05 DIAGNOSIS — F53 Postpartum depression: Secondary | ICD-10-CM | POA: Diagnosis not present

## 2024-06-05 NOTE — Progress Notes (Signed)
 Virtual Visit via Video   I connected with patient on 06/05/24 at 1148 by a video enabled telemedicine application and verified that I am speaking with the correct person using two identifiers.  Location patient: Home Location provider: [facility] Persons participating in the virtual visit: Patient and Provider  I discussed the limitations of evaluation and management by telemedicine and the availability of in person appointments. The patient expressed understanding and agreed to proceed.  Subjective:   HPI:  Pt presents today for  Chief Complaint  Patient presents with   Depression   Anxiety   Discussed the use of AI scribe software for clinical note transcription with the patient, who gave verbal consent to proceed.  History of Present Illness   Tara [name] is a 28 year old female with postpartum depression who presents for follow-up on Zoloft  treatment. She is accompanied by her baby daughter.  Mood and affect - Postpartum depression managed with Zoloft  for almost four months - No significant symptoms of depression or anxiety - Able to perform daily activities and care for her baby - No constant worry  Headache - Occasional random headaches - Headaches last from one hour to all day - No associated symptoms described  Fatigue and energy level - Occasional fatigue or lack of energy - No impact on ability to care for her baby  Medication management - Currently taking Zoloft  (dose and frequency not specified) - Believes refills are available       ROS per HPI   Patient Active Problem List   Diagnosis Date Noted   History of gestational hypertension 07/13/2023   Status post tubal ligation 07/13/2023   H/O pre-e & PPHTN 01/03/2023   History of preterm delivery 01/03/2023   Smoker 01/03/2023    Social History   Tobacco Use   Smoking status: Every Day    Current packs/day: 0.25    Average packs/day: 0.3 packs/day  for 10.0 years (2.5 ttl pk-yrs)    Types: Cigarettes   Smokeless tobacco: Never   Tobacco comments:    smokes 5-10 cig daily  Substance Use Topics   Alcohol use: Yes    Comment: occ    Current Outpatient Medications:    sertraline  (ZOLOFT ) 50 MG tablet, Take 1 tablet (50 mg total) by mouth daily., Disp: 90 tablet, Rfl: 3  No Known Allergies  Objective:   There were no vitals taken for this visit.  Patient is well-developed, well-nourished in no acute distress.  Resting comfortably at home.  Head is normocephalic, atraumatic.  No labored breathing.  Speech is clear and coherent with logical content.  Patient is alert and oriented at baseline.    Assessment and Plan:   [name] was seen today for depression and anxiety.  Diagnoses and all orders for this visit:  Postpartum depression Anxiety     Postpartum Depression She has been on Zoloft  for almost four months for postpartum depression. Reports significant improvement with no major symptoms of depression or anxiety. Experiences occasional headaches and sometimes low energy. Able to perform daily activities and care for her baby. No significant anxiety or constant worry reported. - Continue Zoloft  for at least six months. - Schedule follow-up appointment in three months to evaluate the possibility of tapering off Zoloft . - Ensure pharmacy has refills for Zoloft ; if not, have pharmacy contact the provider.          Return in about 3 months (around 09/05/2024).  Rosaline Bruns, FNP-C [facility] [address] [place], [place] [address] [phone]  06/05/2024  Time spent with the patient: 14 minutes, of which >50% was spent in obtaining information about symptoms, reviewing previous labs, evaluations, and treatments, counseling about condition (please see the discussed topics above), and developing a plan to further investigate it; had a number of questions which I addressed.

## 2024-06-11 ENCOUNTER — Other Ambulatory Visit: Payer: Self-pay | Admitting: Family

## 2024-09-18 ENCOUNTER — Encounter: Payer: Self-pay | Admitting: Family Medicine

## 2024-09-18 ENCOUNTER — Ambulatory Visit: Admitting: Family Medicine

## 2024-09-18 VITALS — BP 115/77 | HR 85 | Temp 98.1°F | Ht <= 58 in | Wt 126.0 lb

## 2024-09-18 DIAGNOSIS — L282 Other prurigo: Secondary | ICD-10-CM

## 2024-09-18 DIAGNOSIS — F53 Postpartum depression: Secondary | ICD-10-CM | POA: Diagnosis not present

## 2024-09-18 DIAGNOSIS — Z0000 Encounter for general adult medical examination without abnormal findings: Secondary | ICD-10-CM

## 2024-09-18 DIAGNOSIS — Z0184 Encounter for antibody response examination: Secondary | ICD-10-CM

## 2024-09-18 DIAGNOSIS — Z23 Encounter for immunization: Secondary | ICD-10-CM | POA: Diagnosis not present

## 2024-09-18 DIAGNOSIS — Z136 Encounter for screening for cardiovascular disorders: Secondary | ICD-10-CM

## 2024-09-18 DIAGNOSIS — Z130 Encounter for screening for diseases of the blood and blood-forming organs and certain disorders involving the immune mechanism: Secondary | ICD-10-CM

## 2024-09-18 DIAGNOSIS — Z0001 Encounter for general adult medical examination with abnormal findings: Secondary | ICD-10-CM

## 2024-09-18 LAB — LIPID PANEL

## 2024-09-18 MED ORDER — TRIAMCINOLONE ACETONIDE 0.1 % EX CREA: 1.0000 | TOPICAL_CREAM | Freq: Two times a day (BID) | CUTANEOUS | 0 refills | Status: AC

## 2024-09-18 NOTE — Progress Notes (Signed)
 Complete physical exam  Patient: Tara [name]   DOB: 1996-04-11   28 y.o. Female  MRN: [id number]  Subjective:    Chief Complaint  Patient presents with   Depression    3 month follow up    Annual Exam    Andrey Pacha is a 28 y.o. female who presents today for a complete physical exam. She reports consuming a general diet. The patient does not participate in regular exercise at present. She generally feels well. She reports sleeping well. She does not have additional problems to discuss today.    Most recent fall risk assessment:    09/18/2024   12:06 PM  Fall Risk   Falls in the past year? 0  Risk for fall due to : No Fall Risks  Follow up Falls evaluation completed     Most recent depression screenings:    09/18/2024   12:06 PM 06/05/2024   11:53 AM 02/29/2024   11:43 AM 10/25/2023   12:21 PM 09/27/2023   11:35 AM  Depression screen PHQ 2/9  Decreased Interest 0 0 1 2 1   Down, Depressed, Hopeless 1 0 1 2 2   PHQ - 2 Score 1 0 2 4 3   Altered sleeping 0 0 1 3 2   Tired, decreased energy 1 1 1 2 2   Change in appetite 0 0 0 3 2  Feeling bad or failure about yourself  1 0 1 2 2   Trouble concentrating 1 0 0 2 1  Moving slowly or fidgety/restless 0 0 0 0 0  Suicidal thoughts 0 0 0 0 0  PHQ-9 Score 4 1 5 16 12   Difficult doing work/chores Not difficult at all Not difficult at all Somewhat difficult Very difficult Somewhat difficult      09/18/2024   12:06 PM 06/05/2024   11:53 AM 02/29/2024   11:44 AM 10/25/2023   12:21 PM  GAD 7 : Generalized Anxiety Score  Nervous, Anxious, on Edge 0 0 1 2  Control/stop worrying 0 0 1 2  Worry too much - different things 1 0 0 2  Trouble relaxing 1 0 0 2  Restless 0 0 0 1  Easily annoyed or irritable 2 0 2 2  Afraid - awful might happen 0 0 0 2  Total GAD 7 Score 4 0 4 13  Anxiety Difficulty Not difficult at all Not difficult at all Somewhat difficult Very difficult       Vision:Within last year and Dental: No current  dental problems and Receives regular dental care  Patient Active Problem List   Diagnosis Date Noted   History of gestational hypertension 07/13/2023   Status post tubal ligation 07/13/2023   H/O pre-e & PPHTN 01/03/2023   History of preterm delivery 01/03/2023   Smoker 01/03/2023   Past Medical History:  Diagnosis Date   Hx of preeclampsia, prior pregnancy, currently pregnant 01/03/2023   Pre-eclampsia    Past Surgical History:  Procedure Laterality Date   NO PAST SURGERIES     TUBAL LIGATION N/A 07/12/2023   Procedure: POST PARTUM TUBAL LIGATION;  Surgeon: Zina Jerilynn LABOR, MD;  Location: [facility] LD ORS;  Service: Gynecology;  Laterality: N/A;   WISDOM TOOTH EXTRACTION     Social History   Tobacco Use   Smoking status: Every Day    Current packs/day: 0.25    Average packs/day: 0.3 packs/day for 10.0 years (2.5 ttl pk-yrs)    Types: Cigarettes   Smokeless tobacco: Never   Tobacco comments:  smokes 5-10 cig daily  Vaping Use   Vaping status: Never Used  Substance Use Topics   Alcohol use: Yes    Comment: occ   Drug use: No   Social History   Socioeconomic History   Marital status: Married    Spouse name: Not on file   Number of children: 2   Years of education: Not on file   Highest education level: Some college, no degree  Occupational History   Not on file  Tobacco Use   Smoking status: Every Day    Current packs/day: 0.25    Average packs/day: 0.3 packs/day for 10.0 years (2.5 ttl pk-yrs)    Types: Cigarettes   Smokeless tobacco: Never   Tobacco comments:    smokes 5-10 cig daily  Vaping Use   Vaping status: Never Used  Substance and Sexual Activity   Alcohol use: Yes    Comment: occ   Drug use: No   Sexual activity: Yes    Partners: Male    Birth control/protection: None  Other Topics Concern   Not on file  Social History Narrative   Not on file   Social Drivers of Health   Financial Resource Strain: Low Risk  (09/17/2024)   Overall Financial  Resource Strain (CARDIA)    Difficulty of Paying Living Expenses: Not hard at all  Food Insecurity: No Food Insecurity (09/17/2024)   Hunger Vital Sign    Worried About Running Out of Food in the Last Year: Never true    Ran Out of Food in the Last Year: Never true  Transportation Needs: No Transportation Needs (09/17/2024)   PRAPARE - Administrator, Civil Service (Medical): No    Lack of Transportation (Non-Medical): No  Physical Activity: Insufficiently Active (09/17/2024)   Exercise Vital Sign    Days of Exercise per Week: 2 days    Minutes of Exercise per Session: 30 min  Stress: No Stress Concern Present (09/17/2024)   [employer] of Occupational Health - Occupational Stress Questionnaire    Feeling of Stress: Only a little  Social Connections: Moderately Isolated (09/17/2024)   Social Connection and Isolation Panel    Frequency of Communication with Friends and Family: Three times a week    Frequency of Social Gatherings with Friends and Family: Once a week    Attends Religious Services: Never    Database administrator or Organizations: No    Attends Engineer, structural: Not on file    Marital Status: Married  Catering manager Violence: Not At Risk (01/03/2023)   Humiliation, Afraid, Rape, and Kick questionnaire    Fear of Current or Ex-Partner: No    Emotionally Abused: No    Physically Abused: No    Sexually Abused: No   Family Status  Relation Name Status   Mother  Alive   Father  Alive   Sister  Alive   Brother  Alive   Brother  Alive   Son  Alive   Pat Aunt  Deceased       brain tumor   MGM  Alive   MGF  Alive   PGM  Unknown   PGF  Unknown   Maternal GGM  Alive   Daughter  Alive  No partnership data on file   Family History  Problem Relation Age of Onset   Depression Mother    Anxiety disorder Mother    Arthritis Mother    Hypertension Mother    Thyroid  disease Sister  Brain cancer Paternal Aunt    Depression Maternal  Grandmother    Arthritis Maternal Grandmother    Hypertension Maternal Grandmother    Lung cancer Maternal Grandfather    Heart attack Paternal Grandfather    Dementia Maternal Great-grandmother    No Known Allergies    Patient Care Team: Severa Rock HERO, FNP as PCP - General (Family Medicine)   Outpatient Medications Prior to Visit  Medication Sig   sertraline  (ZOLOFT ) 50 MG tablet Take 1 tablet (50 mg total) by mouth daily.   [DISCONTINUED] cetirizine  (ZYRTEC ) 10 MG tablet TAKE 1 TABLET BY MOUTH EVERY DAY   No facility-administered medications prior to visit.    ROS per HPI      Objective:     BP 115/77   Pulse 85   Temp 98.1 F (36.7 C)   Ht 4' 10 (1.473 m)   Wt 126 lb (57.2 kg)   LMP 08/19/2024 (Approximate)   SpO2 95%   BMI 26.33 kg/m  BP Readings from Last 3 Encounters:  09/18/24 115/77  02/29/24 123/61  10/25/23 115/79   Wt Readings from Last 3 Encounters:  09/18/24 126 lb (57.2 kg)  02/29/24 121 lb 9.6 oz (55.2 kg)  10/25/23 129 lb (58.5 kg)   SpO2 Readings from Last 3 Encounters:  09/18/24 95%  02/29/24 97%  10/25/23 99%      Physical Exam Vitals and nursing note reviewed.  Constitutional:      General: She is not in acute distress.    Appearance: Normal appearance. She is well-developed, well-groomed and overweight. She is not ill-appearing, toxic-appearing or diaphoretic.  HENT:     Head: Normocephalic and atraumatic.     Jaw: There is normal jaw occlusion.     Right Ear: Hearing, tympanic membrane, ear canal and external ear normal.     Left Ear: Hearing, tympanic membrane, ear canal and external ear normal.     Nose: Nose normal.     Mouth/Throat:     Lips: Pink.     Mouth: Mucous membranes are moist.     Pharynx: Oropharynx is clear. Uvula midline.  Eyes:     General: Lids are normal.     Extraocular Movements: Extraocular movements intact.     Conjunctiva/sclera: Conjunctivae normal.     Pupils: Pupils are equal, round, and  reactive to light.  Neck:     Thyroid : No thyroid  mass, thyromegaly or thyroid  tenderness.     Vascular: No carotid bruit or JVD.     Trachea: Trachea and phonation normal.  Cardiovascular:     Rate and Rhythm: Normal rate and regular rhythm.     Chest Wall: PMI is not displaced.     Pulses: Normal pulses.     Heart sounds: Normal heart sounds. No murmur heard.    No friction rub. No gallop.  Pulmonary:     Effort: Pulmonary effort is normal. No respiratory distress.     Breath sounds: Normal breath sounds. No wheezing.  Abdominal:     General: Bowel sounds are normal. There is no distension or abdominal bruit.     Palpations: Abdomen is soft. There is no hepatomegaly or splenomegaly.     Tenderness: There is no abdominal tenderness. There is no right CVA tenderness or left CVA tenderness.     Hernia: No hernia is present.  Musculoskeletal:        General: Normal range of motion.     Cervical back: Normal range of motion and neck  supple.     Right lower leg: No edema.     Left lower leg: No edema.  Lymphadenopathy:     Cervical: No cervical adenopathy.  Skin:    General: Skin is warm and dry.     Capillary Refill: Capillary refill takes less than 2 seconds.     Coloration: Skin is not cyanotic, jaundiced or pale.     Findings: No rash.  Neurological:     General: No focal deficit present.     Mental Status: She is alert and oriented to person, place, and time.     Sensory: Sensation is intact.     Motor: Motor function is intact.     Coordination: Coordination is intact.     Gait: Gait is intact.     Deep Tendon Reflexes: Reflexes are normal and symmetric.  Psychiatric:        Attention and Perception: Attention and perception normal.        Mood and Affect: Mood and affect normal.        Speech: Speech normal.        Behavior: Behavior normal. Behavior is cooperative.        Thought Content: Thought content normal.        Cognition and Memory: Cognition and memory  normal.        Judgment: Judgment normal.       Last CBC Lab Results  Component Value Date   WBC 6.6 09/27/2023   HGB 14.6 09/27/2023   HCT 44.8 09/27/2023   MCV 87 09/27/2023   MCH 28.3 09/27/2023   RDW 13.5 09/27/2023   PLT 262 09/27/2023   Last metabolic panel Lab Results  Component Value Date   GLUCOSE 110 (H) 09/27/2023   NA 140 09/27/2023   K 4.0 09/27/2023   CL 103 09/27/2023   CO2 19 (L) 09/27/2023   BUN 8 09/27/2023   CREATININE 0.74 09/27/2023   EGFR 114 09/27/2023   CALCIUM 9.4 09/27/2023   PROT 7.2 09/27/2023   ALBUMIN 4.5 09/27/2023   LABGLOB 2.7 09/27/2023   AGRATIO 2.0 01/03/2023   BILITOT 0.3 09/27/2023   ALKPHOS 104 09/27/2023   AST 16 09/27/2023   ALT 16 09/27/2023   ANIONGAP 9 07/13/2023   Last thyroid  functions Lab Results  Component Value Date   TSH 0.529 09/27/2023   T4TOTAL 8.6 09/27/2023      Assessment & Plan:    Routine Health Maintenance and Physical Exam  Immunization History  Administered Date(s) Administered   Influenza, Seasonal, Injecte, Preservative Fre 09/27/2023, 09/18/2024   Influenza-Unspecified 10/12/2018   PNEUMOCOCCAL CONJUGATE-20 09/18/2024   Tdap 04/14/2023    Health Maintenance  Topic Date Due   Hepatitis B Vaccines 19-59 Average Risk (1 of 3 - 19+ 3-dose series) Never done   COVID-19 Vaccine (1 - 2025-26 season) 10/04/2024 (Originally 08/06/2024)   HPV VACCINES (1 - 3-dose SCDM series) 09/18/2025 (Originally 04/19/2023)   Cervical Cancer Screening (Pap smear)  01/26/2025   DTaP/Tdap/Td (2 - Td or Tdap) 04/13/2033   Pneumococcal Vaccine  Completed   Influenza Vaccine  Completed   Hepatitis C Screening  Completed   HIV Screening  Completed   Meningococcal B Vaccine  Aged Out    Discussed health benefits of physical activity, and encouraged her to engage in regular exercise appropriate for her age and condition.  Problem List Items Addressed This Visit   None Visit Diagnoses       Annual physical exam     -  Primary   Relevant Orders   Hepatitis B surface antibody,qualitative   CMP14+EGFR   Anemia Profile B   Lipid panel     Encounter for screening for cardiovascular disorders       Relevant Orders   CMP14+EGFR   Anemia Profile B   Lipid panel     Screening for endocrine, nutritional, metabolic and immunity disorder       Relevant Orders   CMP14+EGFR   Anemia Profile B   Lipid panel   TSH   T4, Free     Immunity status testing       Relevant Orders   Hepatitis B surface antibody,qualitative     Need for vaccination         Postpartum depression       Relevant Orders   TSH   T4, Free     Pruritic rash       Relevant Medications   triamcinolone cream (KENALOG) 0.1 %     Immunization due       Relevant Orders   Pneumococcal conjugate vaccine 20-valent (Prevnar 20) (Completed)     Encounter for immunization       Relevant Orders   Flu vaccine trivalent PF, 6mos and older(Flulaval,Afluria,Fluarix,Fluzone) (Completed)         Recurrent contact dermatitis of buttocks Recurrent rash on buttocks after swimming, likely irritant contact dermatitis. Differential includes solar dermatitis, but presentation suggests contact irritation. - Prescribe triamcinolone cream for use when rash occurs  Recurrent headaches Occasional headaches lasting about 45 minutes, relieved by ibuprofen  or Tylenol . - Continue using ibuprofen  or Tylenol  as needed for headache relief  Dysmenorrhea and menorrhagia after tubal ligation Increased menstrual pain and heaviness since tubal ligation. Possible thyroid  involvement to be assessed. - Check thyroid  function - Discuss symptoms with GYN during annual visit  Depression Depression is well-managed with current sertraline  regimen. No side effects reported and medication supply is adequate. - Continue current sertraline  regimen - Ensure a year's supply of sertraline  is available  Immunization management Due for flu and pneumonia vaccines. Hepatitis  B immunity status needs to be checked as adults may require boosters. - Administer flu vaccine - Administer pneumonia vaccine - Check hepatitis B immunity status  General Health Maintenance Routine adult wellness visit to update labs and assess overall health status. General health maintenance including PAPs is up to date. No current issues with bowel habits, hearing, vision, dental health, or leg swelling. - Update all labs including blood count, thyroid  function, and diabetes screening - Plan for annual follow-up unless issues arise       Return in about 1 year (around 09/18/2025), or if symptoms worsen or fail to improve, for Annual Physical.     Rosaline Bruns, FNP

## 2024-09-19 ENCOUNTER — Ambulatory Visit: Payer: Self-pay | Admitting: Family Medicine

## 2024-09-19 LAB — LIPID PANEL
Cholesterol, Total: 149 mg/dL (ref 100–199)
HDL: 33 mg/dL — AB (ref >39)
LDL CALC COMMENT:: 4.5 ratio — AB (ref 0.0–4.4)
LDL Chol Calc (NIH): 67 mg/dL (ref 0–99)
Triglycerides: 309 mg/dL — AB (ref 0–149)
VLDL Cholesterol Cal: 49 mg/dL — AB (ref 5–40)

## 2024-09-19 LAB — CMP14+EGFR
ALT: 15 IU/L (ref 0–32)
AST: 27 IU/L (ref 0–40)
Albumin: 4.5 g/dL (ref 4.0–5.0)
Alkaline Phosphatase: 81 IU/L (ref 41–116)
BUN/Creatinine Ratio: 13 (ref 9–23)
BUN: 8 mg/dL (ref 6–20)
Bilirubin Total: 0.2 mg/dL (ref 0.0–1.2)
CO2: 22 mmol/L (ref 20–29)
Calcium: 8.5 mg/dL — AB (ref 8.7–10.2)
Chloride: 103 mmol/L (ref 96–106)
Creatinine, Ser: 0.6 mg/dL (ref 0.57–1.00)
Globulin, Total: 2.1 g/dL (ref 1.5–4.5)
Glucose: 113 mg/dL — AB (ref 70–99)
Potassium: 3.9 mmol/L (ref 3.5–5.2)
Sodium: 140 mmol/L (ref 134–144)
Total Protein: 6.6 g/dL (ref 6.0–8.5)
eGFR: 125 mL/min/1.73 (ref >59)

## 2024-09-19 LAB — ANEMIA PROFILE B
Basophils Absolute: 0 x10E3/uL (ref 0.0–0.2)
Basos: 0 %
EOS (ABSOLUTE): 0 x10E3/uL (ref 0.0–0.4)
Eos: 0 %
Ferritin: 195 ng/mL — AB (ref 15–150)
Folate: 9 ng/mL (ref >3.0)
Hematocrit: 42.4 % (ref 34.0–46.6)
Hemoglobin: 14.2 g/dL (ref 11.1–15.9)
Immature Grans (Abs): 0 x10E3/uL (ref 0.0–0.1)
Immature Granulocytes: 0 %
Iron Saturation: 21 % (ref 15–55)
Iron: 68 ug/dL (ref 27–159)
Lymphocytes Absolute: 2.5 x10E3/uL (ref 0.7–3.1)
Lymphs: 49 %
MCH: 29.6 pg (ref 26.6–33.0)
MCHC: 33.5 g/dL (ref 31.5–35.7)
MCV: 88 fL (ref 79–97)
Monocytes Absolute: 0.3 x10E3/uL (ref 0.1–0.9)
Monocytes: 6 %
Neutrophils Absolute: 2.4 x10E3/uL (ref 1.4–7.0)
Neutrophils: 45 %
Platelets: 126 x10E3/uL — ABNORMAL LOW (ref 150–450)
RBC: 4.8 x10E6/uL (ref 3.77–5.28)
RDW: 12.9 % (ref 11.7–15.4)
Retic Ct Pct: 0.9 % (ref 0.6–2.6)
Total Iron Binding Capacity: 326 ug/dL (ref 250–450)
UIBC: 258 ug/dL (ref 131–425)
Vitamin B-12: 513 pg/mL (ref 232–1245)
WBC: 5.2 x10E3/uL (ref 3.4–10.8)

## 2024-09-19 LAB — HEPATITIS B SURFACE ANTIBODY,QUALITATIVE: Hep B Surface Ab, Qual: NONREACTIVE

## 2024-09-19 LAB — T4, FREE: Free T4: 1.19 ng/dL (ref 0.82–1.77)

## 2024-09-19 LAB — TSH: TSH: 0.803 u[IU]/mL (ref 0.450–4.500)

## 2024-10-03 ENCOUNTER — Encounter: Payer: Self-pay | Admitting: Family Medicine

## 2024-10-03 ENCOUNTER — Encounter: Admitting: Family Medicine

## 2024-10-03 ENCOUNTER — Other Ambulatory Visit

## 2024-10-03 DIAGNOSIS — R7989 Other specified abnormal findings of blood chemistry: Secondary | ICD-10-CM

## 2024-10-03 NOTE — Progress Notes (Signed)
 Lab visit only, do not no show.

## 2024-10-04 ENCOUNTER — Ambulatory Visit: Payer: Self-pay | Admitting: Family Medicine

## 2024-10-04 LAB — ANEMIA PROFILE B
Basophils Absolute: 0 x10E3/uL (ref 0.0–0.2)
Basos: 1 %
EOS (ABSOLUTE): 0.1 x10E3/uL (ref 0.0–0.4)
Eos: 1 %
Ferritin: 97 ng/mL (ref 15–150)
Folate: 7.9 ng/mL (ref >3.0)
Hematocrit: 43 % (ref 34.0–46.6)
Hemoglobin: 14.3 g/dL (ref 11.1–15.9)
Immature Grans (Abs): 0 x10E3/uL (ref 0.0–0.1)
Immature Granulocytes: 0 %
Iron Saturation: 35 % (ref 15–55)
Iron: 115 ug/dL (ref 27–159)
Lymphocytes Absolute: 3.6 x10E3/uL — ABNORMAL HIGH (ref 0.7–3.1)
Lymphs: 52 %
MCH: 28.8 pg (ref 26.6–33.0)
MCHC: 33.3 g/dL (ref 31.5–35.7)
MCV: 87 fL (ref 79–97)
Monocytes Absolute: 0.4 x10E3/uL (ref 0.1–0.9)
Monocytes: 6 %
Neutrophils Absolute: 2.8 x10E3/uL (ref 1.4–7.0)
Neutrophils: 40 %
Platelets: 235 x10E3/uL (ref 150–450)
RBC: 4.97 x10E6/uL (ref 3.77–5.28)
RDW: 12.3 % (ref 11.7–15.4)
Retic Ct Pct: 1.5 % (ref 0.6–2.6)
Total Iron Binding Capacity: 327 ug/dL (ref 250–450)
UIBC: 212 ug/dL (ref 131–425)
Vitamin B-12: 630 pg/mL (ref 232–1245)
WBC: 7 x10E3/uL (ref 3.4–10.8)
